# Patient Record
Sex: Male | Born: 1960
Health system: Southern US, Community
[De-identification: ages and names within clinical notes are randomized; demographics above are authoritative.]

## PROBLEM LIST (undated history)

## (undated) DIAGNOSIS — I10 Essential (primary) hypertension: Secondary | ICD-10-CM

## (undated) DIAGNOSIS — M87052 Idiopathic aseptic necrosis of left femur: Secondary | ICD-10-CM

## (undated) DIAGNOSIS — Z86718 Personal history of other venous thrombosis and embolism: Secondary | ICD-10-CM

## (undated) DIAGNOSIS — L03032 Cellulitis of left toe: Secondary | ICD-10-CM

## (undated) DIAGNOSIS — K219 Gastro-esophageal reflux disease without esophagitis: Secondary | ICD-10-CM

## (undated) DIAGNOSIS — G4730 Sleep apnea, unspecified: Secondary | ICD-10-CM

## (undated) HISTORY — PX: COLONOSCOPY: SHX174

## (undated) HISTORY — DX: Personal history of other venous thrombosis and embolism: Z86.718

## (undated) HISTORY — DX: Essential (primary) hypertension: I10

## (undated) HISTORY — PX: BACK SURGERY: SHX140

## (undated) HISTORY — PX: TONSILLECTOMY: SUR1361

## (undated) HISTORY — PX: KNEE SURGERY: SHX244

---

## 2004-10-01 ENCOUNTER — Ambulatory Visit: Payer: Self-pay | Admitting: Internal Medicine

## 2006-10-03 ENCOUNTER — Ambulatory Visit: Payer: Self-pay | Admitting: Unknown Physician Specialty

## 2008-04-15 ENCOUNTER — Ambulatory Visit: Payer: Self-pay | Admitting: Sports Medicine

## 2008-06-17 ENCOUNTER — Ambulatory Visit: Payer: Self-pay | Admitting: Unknown Physician Specialty

## 2008-06-25 ENCOUNTER — Inpatient Hospital Stay: Payer: Self-pay | Admitting: Unknown Physician Specialty

## 2008-07-05 ENCOUNTER — Ambulatory Visit: Payer: Self-pay | Admitting: Internal Medicine

## 2009-05-07 IMAGING — US US EXTREM LOW VENOUS*L*
1 series · 17 of 24 positions shown · non-contrast
Comparison: none

REASON FOR EXAM: Left leg pain and swelling
                  Call report to: [phone]
COMMENTS:

PROCEDURE:     US  - US DOPPLER LOW EXTR LEFT  - July 05, 2008  [DATE]
RESULT:     The LEFT common femoral vein is normally compressible as is the
superficial femoral vein. However, the popliteal vein is not normally
compressible and there is incompletely obstructing thrombus present within
it.

[Series 1: us extrem low venous*left* · 17 of 33 slices shown]
[im 1/33]
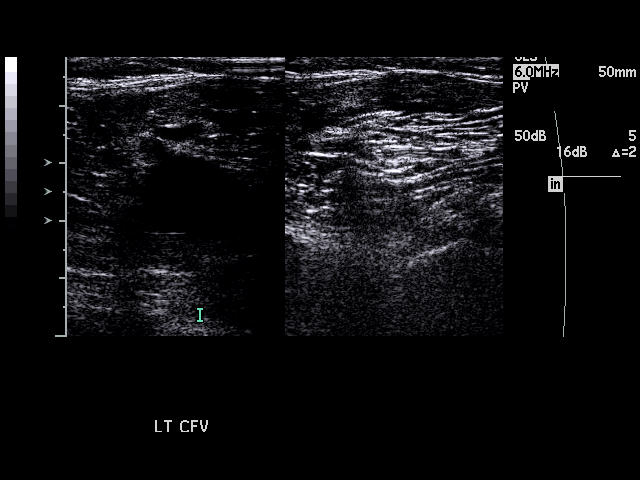
[im 3/33]
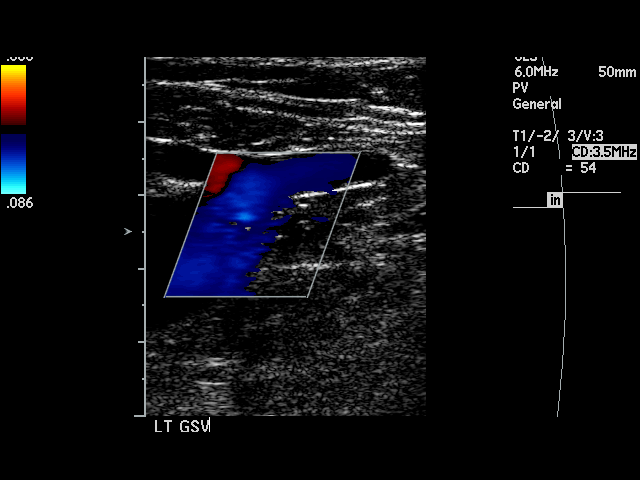
[im 5/33]
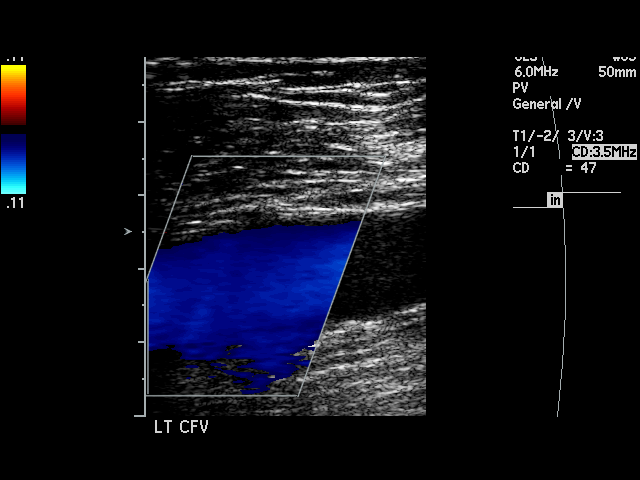
[im 6/33]
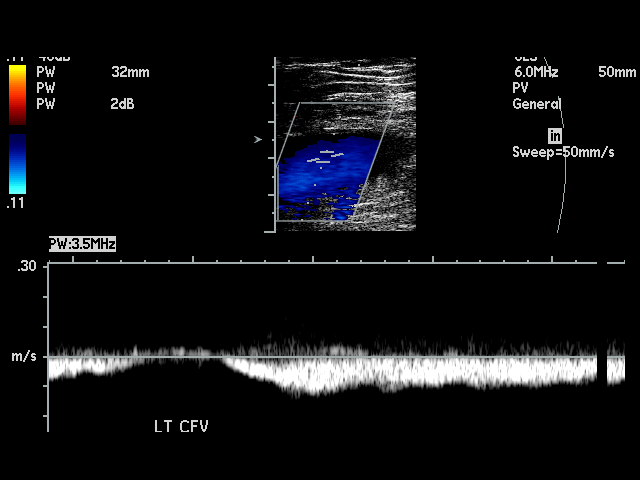
[im 9/33]
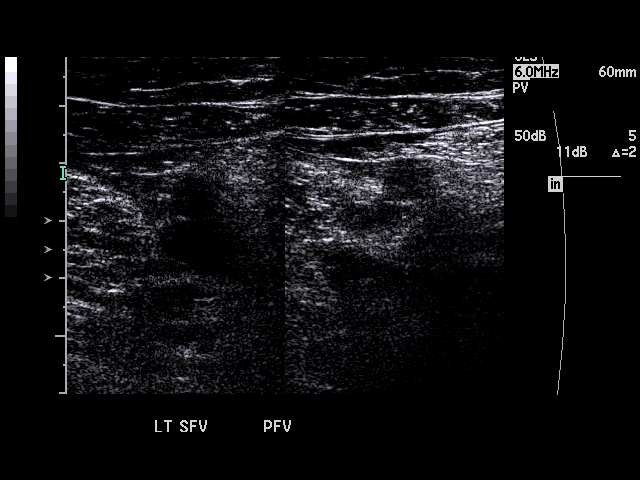
[im 10/33]
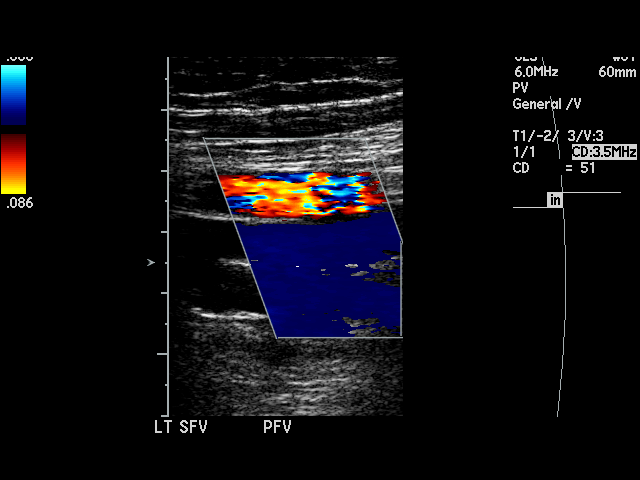
[im 13/33]
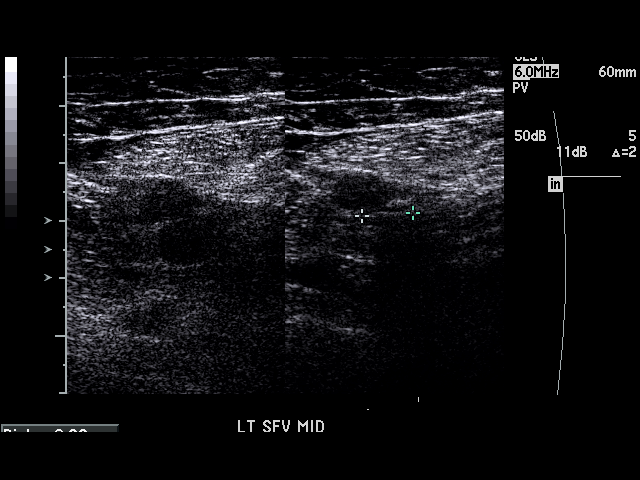
[im 14/33]
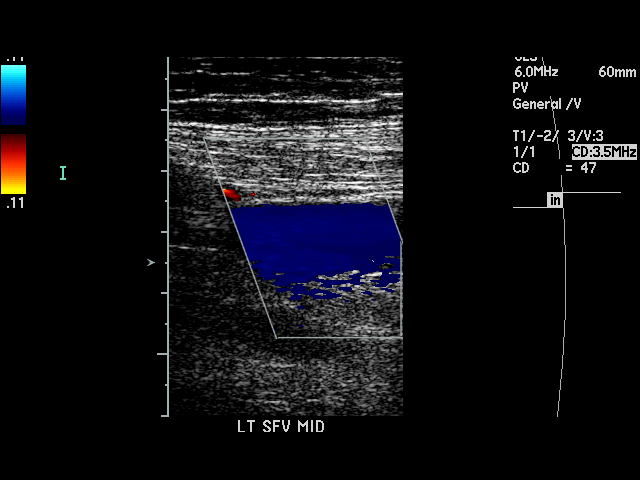
[im 17/33]
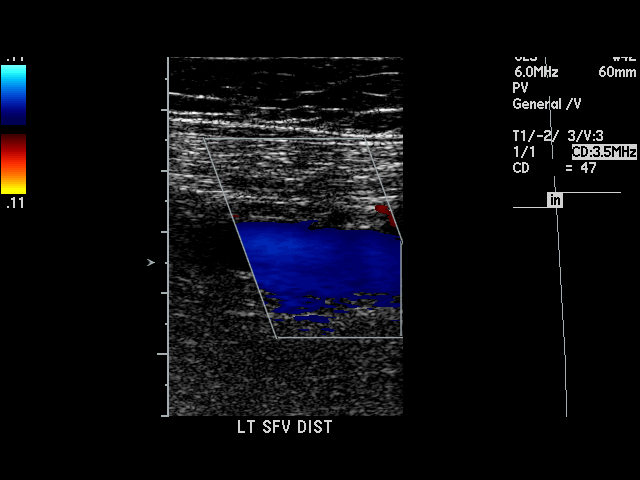
[im 19/33]
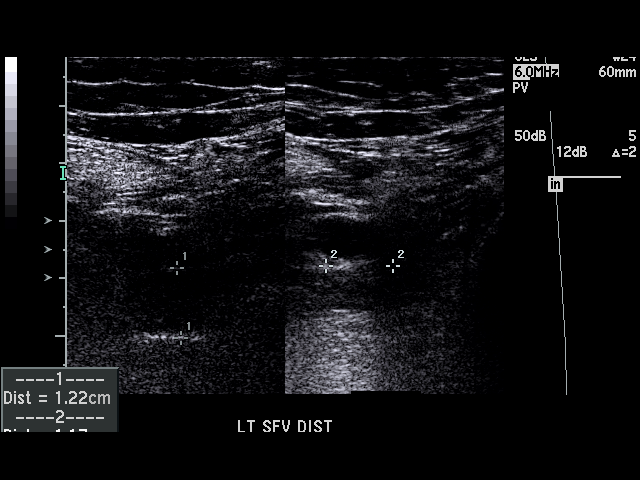
[im 20/33]
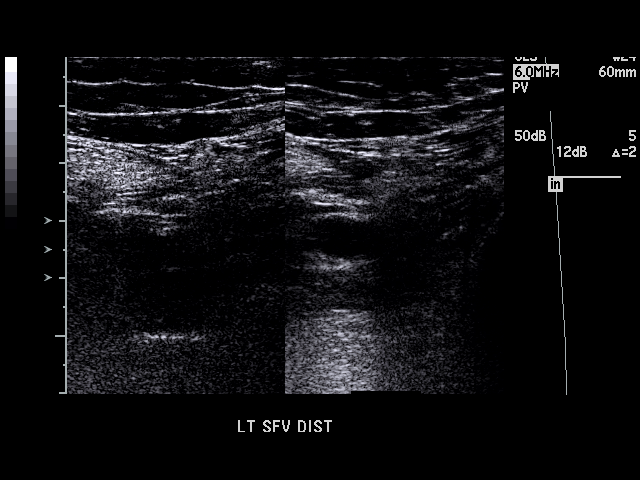
[im 23/33]
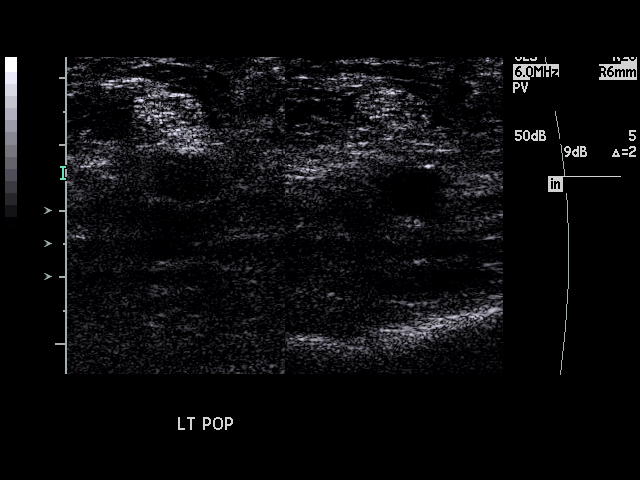
[im 24/33]
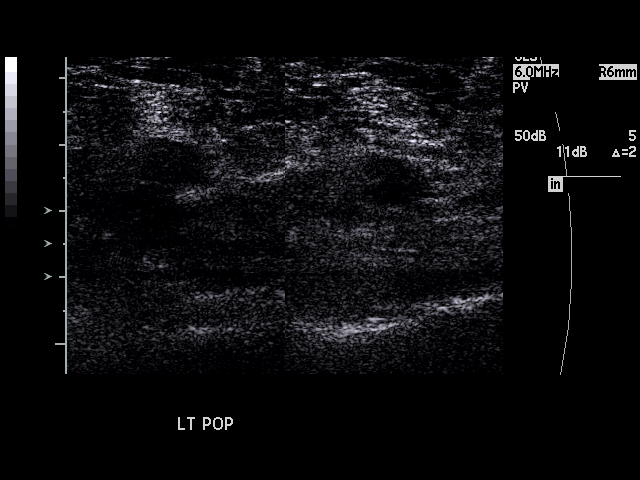
[im 27/33]
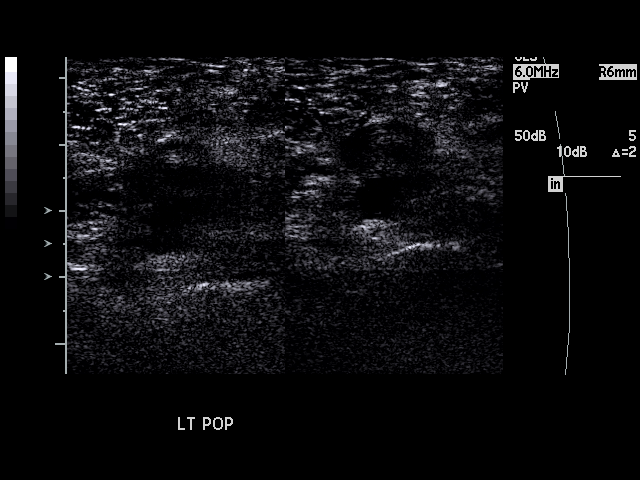
[im 28/33]
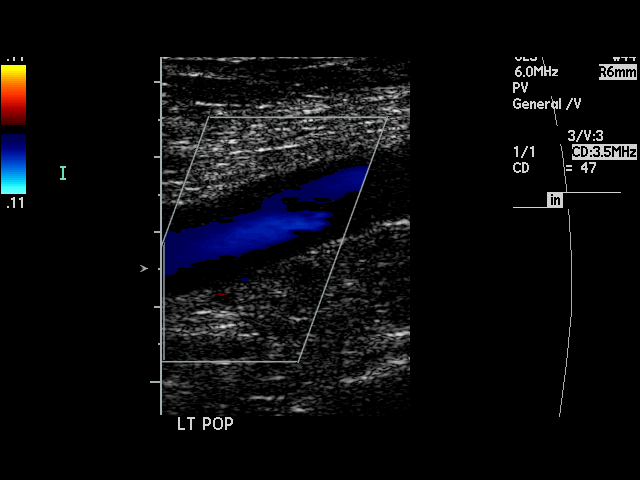
[im 30/33]
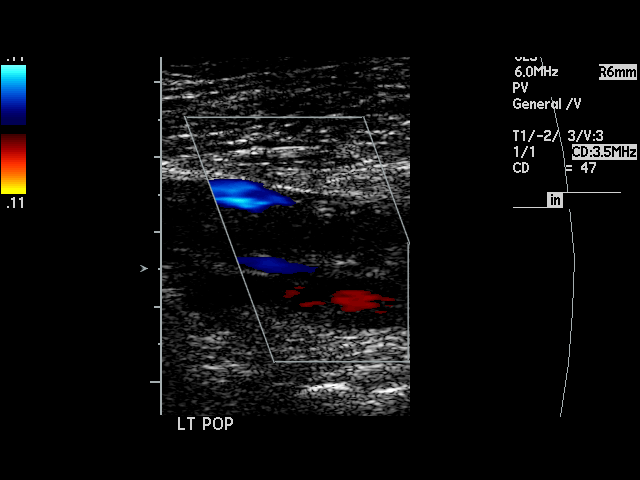
[im 33/33]
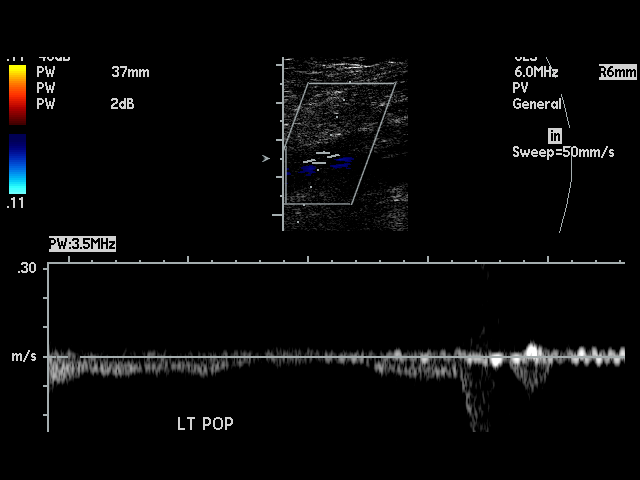

[17 of 24 positions shown; findings below may reference images not displayed]

IMPRESSION: There is non-occlusive thrombus within the popliteal vein
on the LEFT consistent with DVT. The superficial and common femoral veins
appear patent.

This report was called to Dr. [REDACTED] at [DATE] on 07/05/2008.

## 2010-01-10 ENCOUNTER — Ambulatory Visit: Payer: Self-pay

## 2010-01-20 ENCOUNTER — Ambulatory Visit: Payer: Self-pay | Admitting: Unknown Physician Specialty

## 2010-01-21 ENCOUNTER — Ambulatory Visit: Payer: Self-pay | Admitting: Unknown Physician Specialty

## 2012-01-10 ENCOUNTER — Ambulatory Visit: Payer: Self-pay | Admitting: Gastroenterology

## 2012-01-10 LAB — HM COLONOSCOPY

## 2012-07-25 ENCOUNTER — Ambulatory Visit: Payer: Self-pay | Admitting: Family Medicine

## 2012-10-17 ENCOUNTER — Ambulatory Visit: Payer: Self-pay | Admitting: Family Medicine

## 2013-07-11 ENCOUNTER — Ambulatory Visit: Payer: Self-pay | Admitting: Gastroenterology

## 2013-07-11 HISTORY — PX: UPPER GI ENDOSCOPY: SHX6162

## 2013-10-18 ENCOUNTER — Ambulatory Visit: Payer: Self-pay | Admitting: Otolaryngology

## 2013-11-13 ENCOUNTER — Ambulatory Visit (INDEPENDENT_AMBULATORY_CARE_PROVIDER_SITE_OTHER): Payer: BC Managed Care – PPO | Admitting: Pulmonary Disease

## 2013-11-13 ENCOUNTER — Encounter: Payer: Self-pay | Admitting: Pulmonary Disease

## 2013-11-13 VITALS — BP 146/102 | HR 98 | Temp 98.5°F | Ht 73.0 in | Wt 272.0 lb

## 2013-11-13 DIAGNOSIS — R05 Cough: Secondary | ICD-10-CM

## 2013-11-13 DIAGNOSIS — R059 Cough, unspecified: Secondary | ICD-10-CM

## 2013-11-13 MED ORDER — HYDROCOD POLST-CHLORPHEN POLST 10-8 MG/5ML PO LQCR: 5.0000 mL | Freq: Every evening | ORAL | Status: DC | PRN

## 2013-11-13 NOTE — Patient Instructions (Signed)
Take the tussionex for the cough at night, don't take this and drive Stop taking lisinopril  We will have you get blood work for pertussis and call you with the results  You don't need to take the inhaler any more  For sinus congestion/drainage I recommend taking a combination pill of chlorpheniramine and phenylephrine as needed  We will see you back in 6 weeks or sooner if needed

## 2013-11-13 NOTE — Progress Notes (Signed)
Subjective:    Patient ID: Darrell Montes, male    DOB: 1961-01-16, 52 y.o.   MRN: [id number]  HPI  This is a very pleasant 52 year old male who comes to our clinic today for evaluation of cough. He says this is been going on for about the last 6 months and he describes severe coughing paroxysms this will last for as much as 15 minutes at a time. It started insidiously without a clear upper respiratory infection. Although, he feels like he has "had a cold" for the last 6 months. He notes clear mucus production on nearly a daily basis. He has not had fevers or chills. He has not had chest tightness, wheezing, or shortness of breath. He has never been diagnosed with any lung diseases in the past. He never smoke cigarettes throughout his life. His childhood was normal without respiratory illnesses.  He notes that the coughing paroxysms or so bad that he often feels the need to throw up. He says the coughing is much worse at night.   He has acid reflux and about a month ago underwent an EGD during which time he had a Schatzki's ring dilated. He was placed on acid reflux medicine around this time. He says that this has not helped with his shortness of breath and he does not have ongoing acid reflux symptoms.  He does have some sinus congestion left greater than right and postnasal drip. He is currently not taking anything for postnasal drip.  The cough is made worse by going outside into cold air.  He has been on a Z-Pak a couple times and this helps.   Past Medical History  Diagnosis Date  . Hypertension   . Hx of blood clots      No family history on file.   History   Social History  . Marital Status: Married    Spouse Name: N/A    Number of Children: N/A  . Years of Education: N/A   Occupational History  . tech support    Social History Main Topics  . Smoking status: Never Smoker   . Smokeless tobacco: Never Used  . Alcohol Use: 1.5 oz/week    3 drink(s) per week  . Drug  Use: No  . Sexual Activity: Not on file   Other Topics Concern  . Not on file   Social History Narrative  . No narrative on file     Not on File   No outpatient prescriptions prior to visit.   No facility-administered medications prior to visit.      Review of Systems  Constitutional: Negative for fever and unexpected weight change.  HENT: Positive for congestion, postnasal drip and sinus pressure. Negative for dental problem, ear pain, nosebleeds, sneezing, sore throat and trouble swallowing.   Eyes: Negative for redness and itching.  Respiratory: Positive for cough. Negative for chest tightness, shortness of breath and wheezing.   Cardiovascular: Negative for palpitations and leg swelling.  Gastrointestinal: Negative for nausea and vomiting.  Genitourinary: Negative for dysuria.  Musculoskeletal: Negative for joint swelling.  Skin: Negative for rash.  Neurological: Negative for headaches.  Hematological: Does not bruise/bleed easily.  Psychiatric/Behavioral: Negative for dysphoric mood. The patient is not nervous/anxious.        Objective:   Physical Exam  Filed Vitals:   11/13/13 1637  BP: 146/102  Pulse: 98  Temp: 98.5 F (36.9 C)  TempSrc: Oral  Height: 6\' 1"  (1.854 m)  Weight: 272 lb (123.378 kg)  SpO2: 96%  RA  Gen: well appearing, no acute distress HEENT: NCAT, PERRL, EOMi, OP clear, neck supple without masses PULM: CTA B CV: RRR, no mgr, no JVD AB: BS+, soft, nontender, no hsm Ext: warm, no edema, no clubbing, no cyanosis Derm: no rash or skin breakdown Neuro: A&Ox4, CN II-XII intact, strength 5/5 in all 4 extremities  10/2013 CXR normal 10/2013 Simple spirometry > not acceptable by ATS criteria, no clear airflow obstruction     Assessment & Plan:   Cough I think the most likely etiology for [name] cough is the lisinopril at this point. It's very possible that he had a viral illness at the onset that was perpetuated by the  lisinopril. Also I think some degree of cyclical cough or cough perpetuated and cough is also at play.  He had difficulty with simple spirometry today but her recent chest x-ray was normal his lung exam is normal and his oxygenation is normal which encourages me that he doesn't have evidence of underlying pulmonary disease.  The most pragmatic and cost effective approach at this point is to observe him after stopping the lisinopril today. I agree with Dr. Elisabeth Montes decision to stop it.  We will check for pertussis given the post tussive emesis (nearly anyway) and prior response to azithromycin.  Plan: -pertussis antibodies and swab -Full pulmonary function test to evaluate for underlying lung disease (I doubt we will find any) -Continue GERD therapy -To observe off of lisinopril -Use Tussionex at night to help with cough -Followup in 6 weeks if no improvement   Updated Medication List Outpatient Encounter Prescriptions as of 11/13/2013  Medication Sig  . aspirin 81 MG tablet Take 81 mg by mouth daily.  . budesonide (PULMICORT FLEXHALER) 180 MCG/ACT inhaler Inhale 1 puff into the lungs daily.  Marland Kitchen DYMISTA 137-50 MCG/ACT SUSP daily. 1 spray each nostril  . pantoprazole (PROTONIX) 20 MG tablet Take 20 mg by mouth 2 (two) times daily.  . chlorpheniramine-HYDROcodone (TUSSIONEX PENNKINETIC ER) 10-8 MG/5ML LQCR Take 5 mLs by mouth at bedtime as needed for cough.

## 2013-11-14 DIAGNOSIS — R05 Cough: Secondary | ICD-10-CM | POA: Insufficient documentation

## 2013-11-14 DIAGNOSIS — R059 Cough, unspecified: Secondary | ICD-10-CM | POA: Insufficient documentation

## 2013-11-14 NOTE — Assessment & Plan Note (Addendum)
I think the most likely etiology for [name] cough is the lisinopril at this point. It's very possible that he had a viral illness at the onset that was perpetuated by the lisinopril. Also I think some degree of cyclical cough or cough perpetuated and cough is also at play.  He had difficulty with simple spirometry today but her recent chest x-ray was normal his lung exam is normal and his oxygenation is normal which encourages me that he doesn't have evidence of underlying pulmonary disease.  The most pragmatic and cost effective approach at this point is to observe him after stopping the lisinopril today. I agree with Dr. Elisabeth Cara decision to stop it.  We will check for pertussis given the post tussive emesis (nearly anyway) and prior response to azithromycin.  Plan: -pertussis antibodies and swab -Full pulmonary function test to evaluate for underlying lung disease (I doubt we will find any) -Continue GERD therapy -To observe off of lisinopril -Use Tussionex at night to help with cough -Followup in 6 weeks if no improvement

## 2013-11-15 ENCOUNTER — Other Ambulatory Visit (INDEPENDENT_AMBULATORY_CARE_PROVIDER_SITE_OTHER): Payer: BC Managed Care – PPO

## 2013-11-15 DIAGNOSIS — R05 Cough: Secondary | ICD-10-CM

## 2013-11-15 DIAGNOSIS — R059 Cough, unspecified: Secondary | ICD-10-CM

## 2013-11-17 LAB — BORDETELLA PERTUSSIS PCR
B parapertussis, DNA: NOT DETECTED
B pertussis, DNA: NOT DETECTED

## 2013-11-17 LAB — B PERTUSSIS IGG/IGM AB: B pertussis IgM Ab, Quant: <1 index (ref 0.0–0.9)

## 2013-11-19 ENCOUNTER — Telehealth: Payer: Self-pay

## 2013-11-19 NOTE — Telephone Encounter (Signed)
Patient returning call.

## 2013-11-19 NOTE — Telephone Encounter (Signed)
Called spoke with patient and advised of lab results / recs as stated by BQ Pt also advised on good hand hygeine and to continue to follow the recommendations from the 12.16.14 ov w/ BQ Pt to call if his cough worsens, or call in 4-6 weeks if there is no improvement Pt also encouraged with call with any questions/concerns Nothing further needed at this time; will sign off.  Per 12.16.14. pv w/ BQ: Patient Instructions     Take the tussionex for the cough at night, don't take this and drive  Stop taking lisinopril  We will have you get blood work for pertussis and call you with the results  You don't need to take the inhaler any more  For sinus congestion/drainage I recommend taking a combination pill of chlorpheniramine and phenylephrine as needed  We will see you back in 6 weeks or sooner if needed

## 2013-11-19 NOTE — Telephone Encounter (Signed)
LMTCB X1 

## 2013-11-19 NOTE — Telephone Encounter (Signed)
Message copied by Velvet Bathe on Mon Nov 19, 2013 12:43 PM ------      Message from: Lupita Leash      Created: Sat Nov 17, 2013  7:22 AM       A,            Please let him know that his pertussis antibodies are positive.  This means he has been exposed to pertussis in the past, but doesn't necessarily mean that he has been infected recently.  If his cough doesn't resolve in 4-6 weeks then we will treat him with an antibiotic again.  At this point I don't think he needs it.            Thanks      B ------

## 2013-12-04 ENCOUNTER — Telehealth: Payer: Self-pay | Admitting: Pulmonary Disease

## 2013-12-04 NOTE — Telephone Encounter (Signed)
lmomtcb x1 

## 2013-12-05 NOTE — Telephone Encounter (Signed)
Pt is requesting labs and OV note be sent to PCP. This has been faxed. Scott Bing, CMA

## 2015-02-04 LAB — LIPID PANEL
CHOLESTEROL: 229 mg/dL — AB (ref 0–200)
HDL: 42 mg/dL (ref 35–70)
LDL Cholesterol: 133 mg/dL
LDL/HDL RATIO: 3
TRIGLYCERIDES: 272 mg/dL — AB (ref 40–160)

## 2015-02-04 LAB — TSH: TSH: 2.61 u[IU]/mL (ref 0.41–5.90)

## 2015-02-04 LAB — CBC AND DIFFERENTIAL
HEMATOCRIT: 43 % (ref 41–53)
HEMOGLOBIN: 14.6 g/dL (ref 13.5–17.5)
Neutrophils Absolute: 4 /uL
Platelets: 144 10*3/uL — AB (ref 150–399)
WBC: 5.9 10^3/mL

## 2015-02-04 LAB — HEPATIC FUNCTION PANEL
ALT: 28 U/L (ref 10–40)
AST: 27 U/L (ref 14–40)
Alkaline Phosphatase: 105 U/L (ref 25–125)
BILIRUBIN, TOTAL: 0.4 mg/dL

## 2015-02-04 LAB — BASIC METABOLIC PANEL
BUN: 15 mg/dL (ref 4–21)
CREATININE: 0.7 mg/dL (ref 0.6–1.3)
Glucose: 102 mg/dL
POTASSIUM: 4.9 mmol/L (ref 3.4–5.3)
Sodium: 142 mmol/L (ref 137–147)

## 2015-04-02 LAB — PSA: PSA: 2.2

## 2015-07-28 ENCOUNTER — Telehealth: Payer: Self-pay | Admitting: Family Medicine

## 2015-07-28 NOTE — Telephone Encounter (Signed)
See below please-aa 

## 2015-07-28 NOTE — Telephone Encounter (Signed)
Pt got really bad sunburn on his feet last week.  Still has blister.  Wants to know if there is anything you can send to the pharmacy for him.  He uses [facility].    His call back [phone]  Lavonne Chick

## 2015-07-28 NOTE — Telephone Encounter (Signed)
Pt advised-aa 

## 2015-07-28 NOTE — Telephone Encounter (Signed)
Nothing that works any better than over-the-counter topicals. Half pharmacist help him find him to medical treatment for that.

## 2015-08-14 ENCOUNTER — Other Ambulatory Visit: Payer: Self-pay | Admitting: Family Medicine

## 2015-09-15 ENCOUNTER — Other Ambulatory Visit: Payer: Self-pay | Admitting: Family Medicine

## 2015-09-19 DIAGNOSIS — G4700 Insomnia, unspecified: Secondary | ICD-10-CM | POA: Insufficient documentation

## 2015-09-19 DIAGNOSIS — I10 Essential (primary) hypertension: Secondary | ICD-10-CM | POA: Insufficient documentation

## 2015-09-19 DIAGNOSIS — Z6837 Body mass index (BMI) 37.0-37.9, adult: Secondary | ICD-10-CM | POA: Insufficient documentation

## 2015-09-19 DIAGNOSIS — G5630 Lesion of radial nerve, unspecified upper limb: Secondary | ICD-10-CM | POA: Insufficient documentation

## 2015-09-19 DIAGNOSIS — G4733 Obstructive sleep apnea (adult) (pediatric): Secondary | ICD-10-CM | POA: Insufficient documentation

## 2015-09-19 DIAGNOSIS — E669 Obesity, unspecified: Secondary | ICD-10-CM | POA: Insufficient documentation

## 2015-09-19 DIAGNOSIS — I82509 Chronic embolism and thrombosis of unspecified deep veins of unspecified lower extremity: Secondary | ICD-10-CM | POA: Insufficient documentation

## 2015-09-19 DIAGNOSIS — E785 Hyperlipidemia, unspecified: Secondary | ICD-10-CM | POA: Insufficient documentation

## 2015-09-19 DIAGNOSIS — K219 Gastro-esophageal reflux disease without esophagitis: Secondary | ICD-10-CM | POA: Insufficient documentation

## 2015-09-19 DIAGNOSIS — J309 Allergic rhinitis, unspecified: Secondary | ICD-10-CM | POA: Insufficient documentation

## 2015-09-19 DIAGNOSIS — I82409 Acute embolism and thrombosis of unspecified deep veins of unspecified lower extremity: Secondary | ICD-10-CM

## 2015-10-01 ENCOUNTER — Ambulatory Visit (INDEPENDENT_AMBULATORY_CARE_PROVIDER_SITE_OTHER): Payer: BLUE CROSS/BLUE SHIELD | Admitting: Family Medicine

## 2015-10-01 ENCOUNTER — Encounter: Payer: Self-pay | Admitting: Family Medicine

## 2015-10-01 VITALS — BP 128/84 | HR 76 | Temp 98.9°F | Resp 14 | Ht 74.0 in | Wt 273.0 lb

## 2015-10-01 DIAGNOSIS — G4733 Obstructive sleep apnea (adult) (pediatric): Secondary | ICD-10-CM | POA: Diagnosis not present

## 2015-10-01 DIAGNOSIS — L309 Dermatitis, unspecified: Secondary | ICD-10-CM | POA: Diagnosis not present

## 2015-10-01 DIAGNOSIS — Z23 Encounter for immunization: Secondary | ICD-10-CM

## 2015-10-01 DIAGNOSIS — E669 Obesity, unspecified: Secondary | ICD-10-CM | POA: Diagnosis not present

## 2015-10-01 DIAGNOSIS — E785 Hyperlipidemia, unspecified: Secondary | ICD-10-CM

## 2015-10-01 DIAGNOSIS — G4700 Insomnia, unspecified: Secondary | ICD-10-CM | POA: Diagnosis not present

## 2015-10-01 DIAGNOSIS — I10 Essential (primary) hypertension: Secondary | ICD-10-CM | POA: Diagnosis not present

## 2015-10-01 DIAGNOSIS — K219 Gastro-esophageal reflux disease without esophagitis: Secondary | ICD-10-CM | POA: Diagnosis not present

## 2015-10-01 MED ORDER — MOMETASONE FUROATE 0.1 % EX CREA: 1.0000 "application " | TOPICAL_CREAM | Freq: Every day | CUTANEOUS | Status: DC

## 2015-10-01 NOTE — Patient Instructions (Signed)
Try to wear CPAP machine every night. This should improve some of your symptoms. Will follow up on the next visit.

## 2015-10-01 NOTE — Progress Notes (Signed)
Patient ID: Darrell Montes, male   DOB: 06/11/1961, 54 y.o.   MRN: [id number]    Subjective:  HPI  Hypertension follow up: Patient was last seen in May 2016. He checks his B/P rarely at the pharmacy and does not remember the readings at this point. BP Readings from Last 3 Encounters:  10/01/15 128/84  04/02/15 124/82  11/13/13 146/102  No cardiac symptoms present.  GERD follow up: Symptoms are controlled on Protonix daily. Rarely has any problems with this.  Insomnia follow up: Patient takes Trazodone about 3 to 4 nights a week. Nocturia Up every 1 and 1/2 to 2 hours.Small amounts. Prior to Admission medications   Medication Sig Start Date End Date Taking? Authorizing Provider  amLODipine (NORVASC) 5 MG tablet TAKE 1 TABLET BY MOUTH ONCE DAILY 09/15/15  Yes Jerrol Banana., MD  aspirin 81 MG tablet Take 81 mg by mouth daily.   Yes Historical Provider, MD  DYMISTA 137-50 MCG/ACT SUSP daily. 1 spray each nostril   Yes Historical Provider, MD  Multiple Vitamin tablet Take by mouth.   Yes Historical Provider, MD  naproxen sodium (ALEVE) 220 MG tablet Take by mouth.   Yes Historical Provider, MD  pantoprazole (PROTONIX) 40 MG tablet TAKE 1 TABLET BY MOUTH ONCE DAILY, 30 MINUTES BEFORE THE EVENING MEAL. 08/14/15  Yes Anique Beckley Maceo Pro., MD  traZODone (DESYREL) 100 MG tablet Take by mouth. 02/03/15  Yes Historical Provider, MD    Patient Active Problem List   Diagnosis Date Noted  . Allergic rhinitis 09/19/2015  . Acute thromboembolism of deep veins of lower extremity ([place]) 09/19/2015  . Essential (primary) hypertension 09/19/2015  . Acid reflux 09/19/2015  . HLD (hyperlipidemia) 09/19/2015  . Adiposity 09/19/2015  . Obstructive apnea 09/19/2015  . Cannot sleep 09/19/2015  . Radial nerve disease 09/19/2015  . Cough 11/14/2013    Past Medical History  Diagnosis Date  . Hypertension   . Hx of blood clots     Social History   Social History  . Marital Status: Single      Spouse Name: N/A  . Number of Children: N/A  . Years of Education: N/A   Occupational History  . tech support    Social History Main Topics  . Smoking status: Never Smoker   . Smokeless tobacco: Never Used  . Alcohol Use: 1.5 oz/week    3 Standard drinks or equivalent per week     Comment: socialy  . Drug Use: No  . Sexual Activity: Not on file   Other Topics Concern  . Not on file   Social History Narrative    No Known Allergies  Review of Systems  Constitutional: Positive for malaise/fatigue.  Respiratory: Negative.   Cardiovascular: Negative.   Gastrointestinal: Negative.   Genitourinary: Positive for frequency (nocturia).  Musculoskeletal: Positive for back pain (at times).  Skin:       Bumps on left foot  Psychiatric/Behavioral: The patient has insomnia (stable).     Immunization History  Administered Date(s) Administered  . Influenza Split 11/13/2013  . Tdap 04/28/2011   Objective:  BP 128/84 mmHg  Pulse 76  Temp(Src) 98.9 F (37.2 C)  Resp 14  Ht 6\' 2"  (1.88 m)  Wt 273 lb (123.832 kg)  BMI 35.04 kg/m2  Physical Exam  Constitutional: He is oriented to person, place, and time and well-developed, well-nourished, and in no distress.  HENT:  Head: Normocephalic.  Eyes: Conjunctivae are normal. Pupils are equal, round, and reactive  to light.  Neck: Normal range of motion. Neck supple.  Cardiovascular: Normal rate, regular rhythm, normal heart sounds and intact distal pulses.   No murmur heard. Pulmonary/Chest: Effort normal and breath sounds normal. No respiratory distress. He has no wheezes.  Musculoskeletal: Normal range of motion. He exhibits no edema or tenderness.  Neurological: He is alert and oriented to person, place, and time.  Skin: Skin is warm and dry. Rash noted. There is erythema.  On left foot.  Psychiatric: Mood, memory, affect and judgment normal.    Lab Results  Component Value Date   WBC 5.9 02/04/2015   HGB 14.6 02/04/2015    HCT 43 02/04/2015   PLT 144* 02/04/2015   CHOL 229* 02/04/2015   TRIG 272* 02/04/2015   HDL 42 02/04/2015   LDLCALC 133 02/04/2015   TSH 2.61 02/04/2015   PSA 2.2 04/02/2015    CMP     Component Value Date/Time   NA 142 02/04/2015   K 4.9 02/04/2015   BUN 15 02/04/2015   CREATININE 0.7 02/04/2015   AST 27 02/04/2015   ALT 28 02/04/2015   ALKPHOS 105 02/04/2015    Assessment and Plan :  1. Essential (primary) hypertension Stable. Continue current medication.  2. Obstructive apnea Patient is not tolerating Cpap machine mask to be able to wear it every night. Discuss this with patient and the benefits of getting used to the CPAP which could help with insomnia and nocturia.  3. Gastroesophageal reflux disease, esophagitis presence not specified Stable on medication.  4. HLD (hyperlipidemia) Stable.  5. Cannot sleep Stable on Trazodone as needed use.  6. Adiposity Work on habits.  7. Need for influenza vaccination - Flu Vaccine QUAD 36+ mos PF IM (Fluarix & Fluzone Quad PF)  8. Eczema of the left foot Will try Mometasone cream. 9.BPH Patient was seen and examined by Dr. Eulas Post and note was scribed by Theressa Millard, RMA.   Miguel Aschoff MD [facility] 10/01/2015 8:35 AM

## 2015-10-13 ENCOUNTER — Ambulatory Visit (INDEPENDENT_AMBULATORY_CARE_PROVIDER_SITE_OTHER): Payer: BLUE CROSS/BLUE SHIELD | Admitting: Family Medicine

## 2015-10-13 ENCOUNTER — Telehealth: Payer: Self-pay | Admitting: Family Medicine

## 2015-10-13 VITALS — BP 150/92 | HR 76 | Temp 97.8°F | Resp 14 | Wt 276.0 lb

## 2015-10-13 DIAGNOSIS — R21 Rash and other nonspecific skin eruption: Secondary | ICD-10-CM

## 2015-10-13 DIAGNOSIS — B356 Tinea cruris: Secondary | ICD-10-CM

## 2015-10-13 MED ORDER — NYSTATIN 100000 UNIT/GM EX CREA: 1.0000 "application " | TOPICAL_CREAM | Freq: Three times a day (TID) | CUTANEOUS | Status: DC

## 2015-10-13 NOTE — Telephone Encounter (Signed)
Patient advised to be seen.

## 2015-10-13 NOTE — Progress Notes (Signed)
Patient ID: Darrell Montes, male   DOB: [birth year], 54 y.o.   MRN: [id number]   Darrell Montes  MRN: [id number] DOB: Apr 21, 1961  Subjective:  HPI   1. Rash The patient is a 54 year old male who presents for evaluation of a rash.  He states he has had it for about 1 week, however it has gotten worse in the last few days.  It is along the inside of both thighs.  It is itchy, large red areas and not tender.  He has no associated symptoms.  He notes no change in laundry detergent, soaps etc and he ha not been doing anything such as working in the yard that he would think could cause the rash.  Patient Active Problem List   Diagnosis Date Noted  . Allergic rhinitis 09/19/2015  . Acute thromboembolism of deep veins of lower extremity ([place]) 09/19/2015  . Essential (primary) hypertension 09/19/2015  . Acid reflux 09/19/2015  . HLD (hyperlipidemia) 09/19/2015  . Adiposity 09/19/2015  . Obstructive apnea 09/19/2015  . Cannot sleep 09/19/2015  . Radial nerve disease 09/19/2015  . Cough 11/14/2013    Past Medical History  Diagnosis Date  . Hypertension   . Hx of blood clots     Social History   Social History  . Marital Status: Single    Spouse Name: N/A  . Number of Children: N/A  . Years of Education: N/A   Occupational History  . tech support    Social History Main Topics  . Smoking status: Never Smoker   . Smokeless tobacco: Never Used  . Alcohol Use: 1.5 oz/week    3 Standard drinks or equivalent per week     Comment: socialy  . Drug Use: No  . Sexual Activity: Not on file   Other Topics Concern  . Not on file   Social History Narrative    Outpatient Prescriptions Prior to Visit  Medication Sig Dispense Refill  . amLODipine (NORVASC) 5 MG tablet TAKE 1 TABLET BY MOUTH ONCE DAILY 30 tablet 6  . aspirin 81 MG tablet Take 81 mg by mouth daily.    Marland Kitchen DYMISTA 137-50 MCG/ACT SUSP daily. 1 spray each nostril    . mometasone (ELOCON) 0.1 % cream Apply 1 application topically  daily. 45 g 0  . Multiple Vitamin tablet Take by mouth.    . naproxen sodium (ALEVE) 220 MG tablet Take by mouth.    . pantoprazole (PROTONIX) 40 MG tablet TAKE 1 TABLET BY MOUTH ONCE DAILY, 30 MINUTES BEFORE THE EVENING MEAL. 30 tablet 12  . traZODone (DESYREL) 100 MG tablet Take by mouth.     No facility-administered medications prior to visit.    No Known Allergies  Review of Systems  Constitutional: Negative for fever, chills, weight loss, malaise/fatigue and diaphoresis.  Eyes: Negative.   Respiratory: Negative for cough, hemoptysis, sputum production, shortness of breath and wheezing.   Cardiovascular: Negative for chest pain, palpitations, orthopnea, claudication, leg swelling and PND.  Gastrointestinal: Negative.   Musculoskeletal: Negative for myalgias and joint pain.  Skin: Positive for itching and rash.  Neurological: Negative for dizziness, weakness and headaches.   Objective:  BP 150/92 mmHg  Pulse 76  Temp(Src) 97.8 F (36.6 C) (Oral)  Resp 14  Wt 276 lb (125.193 kg)  Physical Exam  Constitutional: He is oriented to person, place, and time and well-developed, well-nourished, and in no distress.  HENT:  Head: Normocephalic and atraumatic.  Right Ear: External ear normal.  Left Ear: External ear normal.  Nose: Nose normal.  Eyes: Conjunctivae are normal.  Neck: Neck supple.  Cardiovascular: Normal rate, regular rhythm and normal heart sounds.   Abdominal: Soft.  Neurological: He is alert and oriented to person, place, and time.  Skin: Skin is warm and dry. Rash noted.  Erythematous rash with satellite lesions in the groin.  Psychiatric: Mood, memory, affect and judgment normal.    Assessment and Plan :  Rash/yeast dermatitis Treat with nystatin cream 3 times a day for a week  Miguel Aschoff MD [facility] 10/13/2015 2:20 PM

## 2015-10-13 NOTE — Telephone Encounter (Signed)
Please call patient about a rash ( irritation ) on his legs. Itching.  Call back is   [phone]  Thanks Con Memos

## 2015-10-14 ENCOUNTER — Ambulatory Visit: Payer: BLUE CROSS/BLUE SHIELD | Admitting: Family Medicine

## 2015-11-19 ENCOUNTER — Other Ambulatory Visit: Payer: Self-pay | Admitting: Family Medicine

## 2015-12-09 ENCOUNTER — Ambulatory Visit (INDEPENDENT_AMBULATORY_CARE_PROVIDER_SITE_OTHER): Payer: BLUE CROSS/BLUE SHIELD | Admitting: Family Medicine

## 2015-12-09 ENCOUNTER — Encounter: Payer: Self-pay | Admitting: Family Medicine

## 2015-12-09 VITALS — BP 112/74 | HR 72 | Temp 97.6°F | Resp 16 | Wt 277.2 lb

## 2015-12-09 DIAGNOSIS — R05 Cough: Secondary | ICD-10-CM | POA: Diagnosis not present

## 2015-12-09 DIAGNOSIS — J069 Acute upper respiratory infection, unspecified: Secondary | ICD-10-CM | POA: Diagnosis not present

## 2015-12-09 DIAGNOSIS — R059 Cough, unspecified: Secondary | ICD-10-CM

## 2015-12-09 MED ORDER — HYDROCODONE-HOMATROPINE 5-1.5 MG/5ML PO SYRP: 5.0000 mL | ORAL_SOLUTION | Freq: Three times a day (TID) | ORAL | Status: DC | PRN

## 2015-12-09 NOTE — Progress Notes (Signed)
Patient ID: Darrell Montes, male   DOB: November 02, 1961, 55 y.o.   MRN: [id number]   Patient: Darrell Montes Male    DOB: 1961-08-11   55 y.o.   MRN: [id number] Visit Date: 12/09/2015  Today's Provider: Vernie Murders, PA   Chief Complaint  Patient presents with  . URI   Subjective:    URI  This is a new problem. The current episode started in the past 7 days. The problem has been unchanged. There has been no fever. Associated symptoms include congestion and coughing. Pertinent negatives include no chest pain, nausea, plugged ear sensation, rhinorrhea, sinus pain, sneezing or sore throat. Associated symptoms comments: Sputum production is clear in color.. He has tried decongestant for the symptoms. The treatment provided no relief.   Patient Active Problem List   Diagnosis Date Noted  . Allergic rhinitis 09/19/2015  . Acute thromboembolism of deep veins of lower extremity ([place]) 09/19/2015  . Essential (primary) hypertension 09/19/2015  . Acid reflux 09/19/2015  . HLD (hyperlipidemia) 09/19/2015  . Adiposity 09/19/2015  . Obstructive apnea 09/19/2015  . Cannot sleep 09/19/2015  . Radial nerve disease 09/19/2015  . Cough 11/14/2013   Past Surgical History  Procedure Laterality Date  . Knee surgery Left     X2 scope  . Back surgery      X2 lower disc   . Upper gi endoscopy  07/11/2013    normal duodenum, benign appearing esophageal stricture x2    Family History  Problem Relation Age of Onset  . COPD Mother   . Bladder Cancer Mother   . Hypertension Father   . COPD Father   . Sleep apnea Father    No Known Allergies   Previous Medications   AMLODIPINE (NORVASC) 5 MG TABLET    TAKE 1 TABLET BY MOUTH ONCE DAILY   ASPIRIN 81 MG TABLET    Take 81 mg by mouth daily.   DYMISTA 137-50 MCG/ACT SUSP    daily. 1 spray each nostril   MOMETASONE (ELOCON) 0.1 % CREAM    APPLY TOPICALLY TO AFFECTED AREA(S) ONCEDAILY.   MULTIPLE VITAMIN TABLET    Take by mouth.   NAPROXEN SODIUM (ALEVE) 220  MG TABLET    Take by mouth.   NYSTATIN CREAM (MYCOSTATIN)    Apply 1 application topically 3 (three) times daily.   PANTOPRAZOLE (PROTONIX) 40 MG TABLET    TAKE 1 TABLET BY MOUTH ONCE DAILY, 30 MINUTES BEFORE THE EVENING MEAL.   TRAZODONE (DESYREL) 100 MG TABLET    Take by mouth.    Review of Systems  Constitutional: Negative.   HENT: Positive for congestion. Negative for rhinorrhea, sneezing and sore throat.   Eyes: Negative.   Respiratory: Positive for cough.        Sputum production  Cardiovascular: Negative.  Negative for chest pain.  Gastrointestinal: Negative.  Negative for nausea.  Endocrine: Negative.   Genitourinary: Negative.   Musculoskeletal: Negative.   Skin: Negative.   Allergic/Immunologic: Negative.   Neurological: Negative.   Hematological: Negative.   Psychiatric/Behavioral: Negative.     Social History  Substance Use Topics  . Smoking status: Never Smoker   . Smokeless tobacco: Never Used  . Alcohol Use: 1.5 oz/week    3 Standard drinks or equivalent per week     Comment: socialy   Objective:   BP 112/74 mmHg  Pulse 72  Temp(Src) 97.6 F (36.4 C) (Oral)  Resp 16  Wt 277 lb 3.2 oz (125.737 kg)  SpO2  98%  Physical Exam  Constitutional: He is oriented to person, place, and time. He appears well-developed and well-nourished.  HENT:  Head: Normocephalic.  Right Ear: External ear normal.  Left Ear: External ear normal.  Mouth/Throat: Oropharynx is clear and moist.  Slightly swollen pink turbinates.  Eyes: Conjunctivae and EOM are normal.  Neck: Normal range of motion. Neck supple.  Cardiovascular: Normal rate, regular rhythm and normal heart sounds.   Pulmonary/Chest: Effort normal and breath sounds normal. He has no wheezes. He has no rales.  Abdominal: Bowel sounds are normal.  Neurological: He is alert and oriented to person, place, and time.  Psychiatric: He has a normal mood and affect. His behavior is normal.      Assessment & Plan:       1. Cough Onset over the past 3-4 days without fever. Some clear sputum. Worse out in the cold and snow shoveling. Increase fluid intake and add Hycodan. Recheck if any purulent sputum or fever development.  - HYDROcodone-homatropine (HYCODAN) 5-1.5 MG/5ML syrup; Take 5 mLs by mouth every 8 (eight) hours as needed for cough.  Dispense: 120 mL; Refill: 0  2. Upper respiratory infection Some nasal stuffiness without post nasal drip or rhinorrhea. May use OTC Flonase at bedtime and recheck prn.

## 2015-12-22 ENCOUNTER — Other Ambulatory Visit: Payer: Self-pay | Admitting: Family Medicine

## 2015-12-22 NOTE — Telephone Encounter (Signed)
This was last prescribed 2 weeks ago. If still having respiratory problems, need follow up appointment to be sure he has not developed pneumonia.

## 2015-12-22 NOTE — Telephone Encounter (Signed)
Pt called to see if RX for HYDROcodone-homatropine (HYCODAN) 5-1.5 MG/5ML syrup had been sent to the pharmacy. I advised that this type of medication would have to be picked up and someone would call when it's ready for pick up if it's approved. Thanks TNP

## 2015-12-22 NOTE — Telephone Encounter (Signed)
Pt contacted office for refill request on the following medications:  HYDROcodone-homatropine (HYCODAN) 5-1.5 MG/5ML syrup.  CB#[phone]/MW

## 2015-12-23 ENCOUNTER — Ambulatory Visit (INDEPENDENT_AMBULATORY_CARE_PROVIDER_SITE_OTHER): Payer: BLUE CROSS/BLUE SHIELD | Admitting: Family Medicine

## 2015-12-23 VITALS — BP 118/84 | HR 80 | Temp 97.5°F | Resp 14 | Wt 275.0 lb

## 2015-12-23 DIAGNOSIS — J40 Bronchitis, not specified as acute or chronic: Secondary | ICD-10-CM

## 2015-12-23 DIAGNOSIS — R05 Cough: Secondary | ICD-10-CM | POA: Diagnosis not present

## 2015-12-23 DIAGNOSIS — R5381 Other malaise: Secondary | ICD-10-CM | POA: Diagnosis not present

## 2015-12-23 DIAGNOSIS — R059 Cough, unspecified: Secondary | ICD-10-CM

## 2015-12-23 MED ORDER — AZITHROMYCIN 250 MG PO TABS: ORAL_TABLET | ORAL | Status: DC

## 2015-12-23 MED ORDER — HYDROCOD POLST-CPM POLST ER 10-8 MG/5ML PO SUER: 5.0000 mL | Freq: Two times a day (BID) | ORAL | Status: DC | PRN

## 2015-12-23 NOTE — Telephone Encounter (Signed)
LMTCB

## 2015-12-23 NOTE — Progress Notes (Signed)
Patient ID: Darrell Montes, male   DOB: May 13, 1961, 55 y.o.   MRN: [id number]    Subjective:  HPI  Patient was seen on January 10th by Simona Huh. For cough and URI and was given Flonase and Hycodan syrup. He has been out of it for 2 days and so he has been using Nyquil which has not helped. Cough keeps him up at night. He is still having post nasal drainage. Today he has been sweating more and breathing has been a little more heavier. No chest pain or tightness, no leg swelling.  Prior to Admission medications   Medication Sig Start Date End Date Taking? Authorizing Provider  amLODipine (NORVASC) 5 MG tablet TAKE 1 TABLET BY MOUTH ONCE DAILY 09/15/15  Yes Jerrol Banana., MD  aspirin 81 MG tablet Take 81 mg by mouth daily.   Yes Historical Provider, MD  DYMISTA 137-50 MCG/ACT SUSP daily. 1 spray each nostril   Yes Historical Provider, MD  mometasone (ELOCON) 0.1 % cream APPLY TOPICALLY TO AFFECTED AREA(S) ONCEDAILY. 11/20/15  Yes Richard Maceo Pro., MD  Multiple Vitamin tablet Take by mouth.   Yes Historical Provider, MD  naproxen sodium (ALEVE) 220 MG tablet Take by mouth.   Yes Historical Provider, MD  nystatin cream (MYCOSTATIN) Apply 1 application topically 3 (three) times daily. 10/13/15  Yes Richard Maceo Pro., MD  pantoprazole (PROTONIX) 40 MG tablet TAKE 1 TABLET BY MOUTH ONCE DAILY, 30 MINUTES BEFORE THE EVENING MEAL. 08/14/15  Yes Richard Maceo Pro., MD  traZODone (DESYREL) 100 MG tablet Take by mouth. 02/03/15  Yes Historical Provider, MD  HYDROcodone-homatropine (HYCODAN) 5-1.5 MG/5ML syrup Take 5 mLs by mouth every 8 (eight) hours as needed for cough. Patient not taking: Reported on 12/23/2015 12/09/15   Margo Common, PA    Patient Active Problem List   Diagnosis Date Noted  . Allergic rhinitis 09/19/2015  . Acute thromboembolism of deep veins of lower extremity ([place]) 09/19/2015  . Essential (primary) hypertension 09/19/2015  . Acid reflux 09/19/2015  . HLD  (hyperlipidemia) 09/19/2015  . Adiposity 09/19/2015  . Obstructive apnea 09/19/2015  . Cannot sleep 09/19/2015  . Radial nerve disease 09/19/2015  . Cough 11/14/2013    Past Medical History  Diagnosis Date  . Hypertension   . Hx of blood clots     Social History   Social History  . Marital Status: Single    Spouse Name: N/A  . Number of Children: N/A  . Years of Education: N/A   Occupational History  . tech support    Social History Main Topics  . Smoking status: Never Smoker   . Smokeless tobacco: Never Used  . Alcohol Use: 1.5 oz/week    3 Standard drinks or equivalent per week     Comment: socialy  . Drug Use: No  . Sexual Activity: Not on file   Other Topics Concern  . Not on file   Social History Narrative    No Known Allergies  Review of Systems  Constitutional: Positive for malaise/fatigue. Negative for fever and chills.  HENT: Positive for congestion.   Respiratory: Positive for cough and shortness of breath.   Cardiovascular: Negative.   Musculoskeletal: Negative.   Neurological: Negative for weakness.  Psychiatric/Behavioral: Negative.     Immunization History  Administered Date(s) Administered  . Influenza Split 11/13/2013  . Influenza,inj,Quad PF,36+ Mos 10/01/2015  . Tdap 04/28/2011   Objective:  BP 118/84 mmHg  Pulse 80  Temp(Src) 97.5 F (  36.4 C)  Resp 14  Wt 275 lb (124.739 kg)  SpO2 96%  Physical Exam  Constitutional: He is oriented to person, place, and time and well-developed, well-nourished, and in no distress.  HENT:  Head: Normocephalic and atraumatic.  Right Ear: External ear normal.  Left Ear: External ear normal.  Eyes: Conjunctivae are normal. Pupils are equal, round, and reactive to light.  Neck: Normal range of motion. Neck supple.  Cardiovascular: Normal rate, regular rhythm, normal heart sounds and intact distal pulses.   No murmur heard. Pulmonary/Chest: Effort normal and breath sounds normal. No respiratory  distress. He has no wheezes.  Musculoskeletal: Normal range of motion. He exhibits no edema or tenderness.  Neurological: He is alert and oriented to person, place, and time.  Skin: Skin is warm and dry.  Psychiatric: Mood, memory, affect and judgment normal.    Lab Results  Component Value Date   WBC 5.9 02/04/2015   HGB 14.6 02/04/2015   HCT 43 02/04/2015   PLT 144* 02/04/2015   CHOL 229* 02/04/2015   TRIG 272* 02/04/2015   HDL 42 02/04/2015   LDLCALC 133 02/04/2015   TSH 2.61 02/04/2015   PSA 2.2 04/02/2015    CMP     Component Value Date/Time   NA 142 02/04/2015   K 4.9 02/04/2015   BUN 15 02/04/2015   CREATININE 0.7 02/04/2015   AST 27 02/04/2015   ALT 28 02/04/2015   ALKPHOS 105 02/04/2015    Assessment and Plan :  1. Cough Will switch Hycodan to Tussionex if patient can afford. May need Prednisone and/or chest xray. Follow as needed. - chlorpheniramine-HYDROcodone (TUSSIONEX PENNKINETIC ER) 10-8 MG/5ML SUER; Take 5 mLs by mouth every 12 (twelve) hours as needed for cough.  Dispense: 140 mL; Refill: 0  2. Malaise  3. Bronchitis Treat with Zpak. - azithromycin (ZITHROMAX) 250 MG tablet; As directed  Dispense: 6 tablet; Refill: 0  I have done the exam and reviewed the above chart and it is accurate to the best of my knowledge.  Miguel Aschoff MD [facility] 12/23/2015 3:55 PM

## 2015-12-23 NOTE — Telephone Encounter (Signed)
Patient advised as directed below. Darrell Montes does not have any available appointments until Thursday. Patient scheduled with Dr. Rosanna Randy today.

## 2016-02-25 ENCOUNTER — Ambulatory Visit (INDEPENDENT_AMBULATORY_CARE_PROVIDER_SITE_OTHER): Payer: BLUE CROSS/BLUE SHIELD | Admitting: Family Medicine

## 2016-02-25 ENCOUNTER — Encounter: Payer: Self-pay | Admitting: Family Medicine

## 2016-02-25 VITALS — BP 122/78 | HR 74 | Temp 97.6°F | Resp 18 | Ht 74.0 in | Wt 273.0 lb

## 2016-02-25 DIAGNOSIS — Z1211 Encounter for screening for malignant neoplasm of colon: Secondary | ICD-10-CM

## 2016-02-25 DIAGNOSIS — R739 Hyperglycemia, unspecified: Secondary | ICD-10-CM

## 2016-02-25 DIAGNOSIS — Z125 Encounter for screening for malignant neoplasm of prostate: Secondary | ICD-10-CM

## 2016-02-25 DIAGNOSIS — Z0000 Encounter for general adult medical examination without abnormal findings: Secondary | ICD-10-CM | POA: Diagnosis not present

## 2016-02-25 DIAGNOSIS — G629 Polyneuropathy, unspecified: Secondary | ICD-10-CM | POA: Diagnosis not present

## 2016-02-25 LAB — POCT URINALYSIS DIPSTICK
Bilirubin, UA: NEGATIVE
Glucose, UA: NEGATIVE
KETONES UA: NEGATIVE
LEUKOCYTES UA: NEGATIVE
Nitrite, UA: NEGATIVE
PH UA: 5
PROTEIN UA: NEGATIVE
RBC UA: NEGATIVE
SPEC GRAV UA: 1.025
Urobilinogen, UA: 0.2

## 2016-02-25 LAB — IFOBT (OCCULT BLOOD): IFOBT: NEGATIVE

## 2016-02-25 NOTE — Progress Notes (Signed)
Patient ID: Darrell Montes, male   DOB: 1961/06/08, 55 y.o.   MRN: [id number]       Patient: Darrell Montes, Male    DOB: [birth year], 55 y.o.   MRN: [id number] Visit Date: 02/25/2016  Today's Provider: Wilhemena Durie, MD   Chief Complaint  Patient presents with  . Annual Exam   Subjective:    Annual physical exam [name] is a 55 y.o. male who presents today for health maintenance and complete physical. He feels well. He reports exercising about 2 times a week, walking. He reports he is sleeping fairly well.  ----------------------------------------------------------------- Colonoscopy- 01/10/12 Diverticulosis, repeat 10 years  Immunization History  Administered Date(s) Administered  . Influenza Split 11/13/2013  . Influenza,inj,Quad PF,36+ Mos 10/01/2015  . Tdap 04/28/2011     Review of Systems  Constitutional: Negative.   HENT: Negative.   Eyes: Negative.   Respiratory: Negative.   Cardiovascular: Negative.   Gastrointestinal: Negative.   Endocrine: Negative.   Genitourinary: Negative.   Musculoskeletal: Negative.   Skin: Negative.   Allergic/Immunologic: Negative.   Neurological: Positive for numbness (bottoms of feet).  Hematological: Negative.   Psychiatric/Behavioral: Negative.     Social History      He  reports that he has never smoked. He has never used smokeless tobacco. He reports that he drinks about 1.5 oz of alcohol per week. He reports that he does not use illicit drugs.       Social History   Social History  . Marital Status: Single    Spouse Name: N/A  . Number of Children: N/A  . Years of Education: N/A   Occupational History  . tech support    Social History Main Topics  . Smoking status: Never Smoker   . Smokeless tobacco: Never Used  . Alcohol Use: 1.5 oz/week    3 Standard drinks or equivalent per week     Comment: socially  . Drug Use: No  . Sexual Activity: Not Asked   Other Topics Concern  . None   Social History  Narrative    Past Medical History  Diagnosis Date  . Hypertension   . Hx of blood clots      Patient Active Problem List   Diagnosis Date Noted  . Allergic rhinitis 09/19/2015  . Acute thromboembolism of deep veins of lower extremity ([place]) 09/19/2015  . Essential (primary) hypertension 09/19/2015  . Acid reflux 09/19/2015  . HLD (hyperlipidemia) 09/19/2015  . Adiposity 09/19/2015  . Obstructive apnea 09/19/2015  . Cannot sleep 09/19/2015  . Radial nerve disease 09/19/2015  . Cough 11/14/2013    Past Surgical History  Procedure Laterality Date  . Knee surgery Left     X2 scope  . Back surgery      X2 lower disc   . Upper gi endoscopy  07/11/2013    normal duodenum, benign appearing esophageal stricture x2     Family History        Family Status  Relation Status Death Age  . Mother Alive   . Father Alive   . Daughter Alive   . Daughter Alive         His family history includes Bladder Cancer in his mother; COPD in his father and mother; Hypertension in his father; Sleep apnea in his father.    No Known Allergies  Previous Medications   AMLODIPINE (NORVASC) 5 MG TABLET    TAKE 1 TABLET BY MOUTH ONCE DAILY   ASPIRIN 81 MG  TABLET    Take 81 mg by mouth daily.   DYMISTA 137-50 MCG/ACT SUSP    daily. 1 spray each nostril   MOMETASONE (ELOCON) 0.1 % CREAM    APPLY TOPICALLY TO AFFECTED AREA(S) ONCEDAILY.   MULTIPLE VITAMIN TABLET    Take by mouth.   NAPROXEN SODIUM (ALEVE) 220 MG TABLET    Take by mouth.   PANTOPRAZOLE (PROTONIX) 40 MG TABLET    TAKE 1 TABLET BY MOUTH ONCE DAILY, 30 MINUTES BEFORE THE EVENING MEAL.   TRAZODONE (DESYREL) 100 MG TABLET    Take by mouth.    Patient Care Team: Jerrol Banana., MD as PCP - General (Family Medicine)     Objective:   Vitals: BP 122/78 mmHg  Pulse 74  Temp(Src) 97.6 F (36.4 C) (Oral)  Resp 8  Ht 6\' 2"  (1.88 m)  Wt 273 lb (123.832 kg)  BMI 35.04 kg/m2   Physical Exam  Constitutional: He is oriented to  person, place, and time. He appears well-developed and well-nourished.  HENT:  Head: Normocephalic and atraumatic.  Right Ear: External ear normal.  Left Ear: External ear normal.  Nose: Nose normal.  Mouth/Throat: Oropharynx is clear and moist.  Eyes: Conjunctivae and EOM are normal. Pupils are equal, round, and reactive to light.  Neck: Normal range of motion. Neck supple.  Cardiovascular: Normal rate, regular rhythm, normal heart sounds and intact distal pulses.   Pulmonary/Chest: Effort normal and breath sounds normal.  Abdominal: Soft. Bowel sounds are normal.  Genitourinary: Rectum normal, prostate normal and penis normal.  Musculoskeletal: Normal range of motion. He exhibits edema.  Right leg no edema. Left leg trace edema.  Neurological: He is alert and oriented to person, place, and time. He has normal reflexes.  Skin: Skin is warm and dry.  Psychiatric: He has a normal mood and affect. His behavior is normal. Judgment and thought content normal.     Depression Screen PHQ 2/9 Scores 02/25/2016 10/01/2015  PHQ - 2 Score 0 0      Assessment & Plan:     Routine Health Maintenance and Physical Exam  Exercise Activities and Dietary recommendations Goals    None      Immunization History  Administered Date(s) Administered  . Influenza Split 11/13/2013  . Influenza,inj,Quad PF,36+ Mos 10/01/2015  . Tdap 04/28/2011    Health Maintenance  Topic Date Due  . Hepatitis C Screening  [birth year]  . HIV Screening  07/20/1976  . INFLUENZA VACCINE  06/29/2016  . TETANUS/TDAP  04/27/2021  . COLONOSCOPY  01/09/2022      Discussed health benefits of physical activity, and encouraged him to engage in regular exercise appropriate for his age and condition.              Obesity              Dietary changes discussed at length.              Hypertension              Hyperlipidemia I have done the exam and reviewed the above chart and it is accurate to the best of my  knowledge.  --------------------------------------------------------------------

## 2016-02-27 ENCOUNTER — Telehealth: Payer: Self-pay

## 2016-02-27 LAB — CBC WITH DIFFERENTIAL/PLATELET
BASOS: 0 %
Basophils Absolute: 0 10*3/uL (ref 0.0–0.2)
EOS (ABSOLUTE): 0.2 10*3/uL (ref 0.0–0.4)
EOS: 3 %
HEMATOCRIT: 43.2 % (ref 37.5–51.0)
HEMOGLOBIN: 14.6 g/dL (ref 12.6–17.7)
IMMATURE GRANS (ABS): 0 10*3/uL (ref 0.0–0.1)
IMMATURE GRANULOCYTES: 1 %
LYMPHS: 19 %
Lymphocytes Absolute: 1 10*3/uL (ref 0.7–3.1)
MCH: 30.6 pg (ref 26.6–33.0)
MCHC: 33.8 g/dL (ref 31.5–35.7)
MCV: 91 fL (ref 79–97)
MONOCYTES: 6 %
Monocytes Absolute: 0.3 10*3/uL (ref 0.1–0.9)
NEUTROS ABS: 3.7 10*3/uL (ref 1.4–7.0)
NEUTROS PCT: 71 %
PLATELETS: 142 10*3/uL — AB (ref 150–379)
RBC: 4.77 x10E6/uL (ref 4.14–5.80)
RDW: 14.3 % (ref 12.3–15.4)
WBC: 5.1 10*3/uL (ref 3.4–10.8)

## 2016-02-27 LAB — PSA: Prostate Specific Ag, Serum: 1.8 ng/mL (ref 0.0–4.0)

## 2016-02-27 LAB — LIPID PANEL WITH LDL/HDL RATIO
CHOLESTEROL TOTAL: 225 mg/dL — AB (ref 100–199)
HDL: 39 mg/dL — AB (ref >39)
LDL CALC: 122 mg/dL — AB (ref 0–99)
LDl/HDL Ratio: 3.1 ratio units (ref 0.0–3.6)
TRIGLYCERIDES: 319 mg/dL — AB (ref 0–149)
VLDL Cholesterol Cal: 64 mg/dL — ABNORMAL HIGH (ref 5–40)

## 2016-02-27 LAB — HEMOGLOBIN A1C
Est. average glucose Bld gHb Est-mCnc: 114 mg/dL
Hgb A1c MFr Bld: 5.6 % (ref 4.8–5.6)

## 2016-02-27 LAB — TSH: TSH: 3.01 u[IU]/mL (ref 0.450–4.500)

## 2016-02-27 LAB — VITAMIN B12: VITAMIN B 12: 316 pg/mL (ref 211–946)

## 2016-02-27 NOTE — Telephone Encounter (Signed)
-----   Message from Jerrol Banana., MD sent at 02/27/2016 12:25 PM EDT ----- Labs stable last year. Continue to work on diet and exercise.

## 2016-02-27 NOTE — Telephone Encounter (Signed)
Left message to call back  

## 2016-03-03 NOTE — Telephone Encounter (Signed)
Pt advised-aa 

## 2016-05-18 ENCOUNTER — Other Ambulatory Visit: Payer: Self-pay | Admitting: Family Medicine

## 2016-07-29 DIAGNOSIS — M7552 Bursitis of left shoulder: Secondary | ICD-10-CM | POA: Diagnosis not present

## 2016-08-24 ENCOUNTER — Ambulatory Visit (INDEPENDENT_AMBULATORY_CARE_PROVIDER_SITE_OTHER): Payer: BLUE CROSS/BLUE SHIELD | Admitting: Family Medicine

## 2016-08-24 ENCOUNTER — Encounter: Payer: Self-pay | Admitting: Family Medicine

## 2016-08-24 VITALS — BP 134/82 | HR 68 | Temp 98.0°F | Resp 14 | Wt 272.0 lb

## 2016-08-24 DIAGNOSIS — E785 Hyperlipidemia, unspecified: Secondary | ICD-10-CM | POA: Diagnosis not present

## 2016-08-24 DIAGNOSIS — I10 Essential (primary) hypertension: Secondary | ICD-10-CM | POA: Diagnosis not present

## 2016-08-24 DIAGNOSIS — Z23 Encounter for immunization: Secondary | ICD-10-CM

## 2016-08-24 DIAGNOSIS — K219 Gastro-esophageal reflux disease without esophagitis: Secondary | ICD-10-CM

## 2016-08-24 NOTE — Progress Notes (Signed)
Subjective:  HPI  Hypertension, follow-up:  BP Readings from Last 3 Encounters:  08/24/16 134/82  02/25/16 122/78  12/23/15 118/84    He was last seen for hypertension 6 months ago.  BP at that visit was 122/78. Management since that visit includes nne. He reports good compliance with treatment. He is not having side effects.  He is exercising. He is adherent to low salt diet.   Outside blood pressures are not being checked. He is experiencing none.  Patient denies chest pain, chest pressure/discomfort, claudication, dyspnea, exertional chest pressure/discomfort, fatigue, irregular heart beat, lower extremity edema, near-syncope, orthopnea, palpitations, paroxysmal nocturnal dyspnea and syncope.   Wt Readings from Last 3 Encounters:  08/24/16 272 lb (123.4 kg)  02/25/16 273 lb (123.8 kg)  12/23/15 275 lb (124.7 kg)   ------------------------------------------------------------------------  Pt reports that his GERD is doing well. "Every once in a while I will have an episode where it comes up but it is few and far between."   Prior to Admission medications   Medication Sig Start Date End Date Taking? Authorizing Provider  amLODipine (NORVASC) 5 MG tablet TAKE 1 TABLET BY MOUTH ONCE DAILY 05/18/16   Jerrol Banana., MD  aspirin 81 MG tablet Take 81 mg by mouth daily.    Historical Provider, MD  DYMISTA 137-50 MCG/ACT SUSP daily. 1 spray each nostril    Historical Provider, MD  mometasone (ELOCON) 0.1 % cream APPLY TOPICALLY TO AFFECTED AREA(S) ONCEDAILY. 11/20/15   Jerrol Banana., MD  Multiple Vitamin tablet Take by mouth.    Historical Provider, MD  naproxen sodium (ALEVE) 220 MG tablet Take by mouth.    Historical Provider, MD  pantoprazole (PROTONIX) 40 MG tablet TAKE 1 TABLET BY MOUTH ONCE DAILY, 30 MINUTES BEFORE THE EVENING MEAL. 08/14/15   Richard Maceo Pro., MD  traZODone (DESYREL) 100 MG tablet Take by mouth. 02/03/15   Historical Provider, MD     Patient Active Problem List   Diagnosis Date Noted  . Allergic rhinitis 09/19/2015  . Acute thromboembolism of deep veins of lower extremity ([place]) 09/19/2015  . Essential (primary) hypertension 09/19/2015  . Acid reflux 09/19/2015  . HLD (hyperlipidemia) 09/19/2015  . Adiposity 09/19/2015  . Obstructive apnea 09/19/2015  . Cannot sleep 09/19/2015  . Radial nerve disease 09/19/2015  . Cough 11/14/2013    Past Medical History:  Diagnosis Date  . Hx of blood clots   . Hypertension     Social History   Social History  . Marital status: Single    Spouse name: N/A  . Number of children: N/A  . Years of education: N/A   Occupational History  . tech support    Social History Main Topics  . Smoking status: Never Smoker  . Smokeless tobacco: Never Used  . Alcohol use 1.5 oz/week    3 Standard drinks or equivalent per week     Comment: socially  . Drug use: No  . Sexual activity: Not on file   Other Topics Concern  . Not on file   Social History Narrative  . No narrative on file    No Known Allergies  Review of Systems  Constitutional: Negative.   HENT: Negative.        Post nasal drainage  Eyes: Negative.   Respiratory: Negative.   Cardiovascular: Negative.   Gastrointestinal: Negative.   Genitourinary: Negative.   Musculoskeletal: Negative.   Skin: Negative.   Neurological: Negative.   Endo/Heme/Allergies: Negative.  Psychiatric/Behavioral: Negative.     Immunization History  Administered Date(s) Administered  . Influenza Split 11/13/2013  . Influenza,inj,Quad PF,36+ Mos 10/01/2015  . Tdap 04/28/2011   Objective:  BP 134/82 (BP Location: Left Arm, Patient Position: Sitting, Cuff Size: Large)   Pulse 68   Temp 98 F (36.7 C) (Oral)   Resp 14   Wt 272 lb (123.4 kg)   BMI 34.92 kg/m   Physical Exam  Constitutional: He is oriented to person, place, and time and well-developed, well-nourished, and in no distress.  HENT:  Head: Normocephalic  and atraumatic.  Right Ear: External ear normal.  Left Ear: External ear normal.  Nose: Nose normal.  Eyes: Conjunctivae are normal.  Neck: Neck supple.  Cardiovascular: Normal rate, regular rhythm and normal heart sounds.   Pulmonary/Chest: Effort normal and breath sounds normal.  Abdominal: Soft.  Neurological: He is alert and oriented to person, place, and time.  Skin: Skin is warm and dry.  Psychiatric: Mood, memory, affect and judgment normal.    Lab Results  Component Value Date   WBC 5.1 02/26/2016   HGB 14.6 02/04/2015   HCT 43.2 02/26/2016   PLT 142 (L) 02/26/2016   CHOL 225 (H) 02/26/2016   TRIG 319 (H) 02/26/2016   HDL 39 (L) 02/26/2016   LDLCALC 122 (H) 02/26/2016   TSH 3.010 02/26/2016   PSA 2.2 04/02/2015   HGBA1C 5.6 02/26/2016    CMP     Component Value Date/Time   NA 142 02/04/2015   K 4.9 02/04/2015   BUN 15 02/04/2015   CREATININE 0.7 02/04/2015   AST 27 02/04/2015   ALT 28 02/04/2015   ALKPHOS 105 02/04/2015    Assessment and Plan :  1. Essential (primary) hypertension   2. HLD (hyperlipidemia)   3. Gastroesophageal reflux disease, esophagitis presence not specified   4. Need for influenza vaccination  - Flu Vaccine QUAD 36+ mos IM 5.Obesity Diet and exercise stressed. I have done the exam and reviewed the above chart and it is accurate to the best of my knowledge.  Miguel Aschoff MD [facility] 08/24/2016 8:30 AM

## 2016-08-26 ENCOUNTER — Other Ambulatory Visit: Payer: Self-pay | Admitting: Family Medicine

## 2016-08-30 ENCOUNTER — Other Ambulatory Visit: Payer: Self-pay | Admitting: Family Medicine

## 2016-09-29 ENCOUNTER — Encounter: Payer: Self-pay | Admitting: Family Medicine

## 2016-09-29 ENCOUNTER — Ambulatory Visit (INDEPENDENT_AMBULATORY_CARE_PROVIDER_SITE_OTHER): Payer: BLUE CROSS/BLUE SHIELD | Admitting: Family Medicine

## 2016-09-29 VITALS — BP 106/78 | HR 84 | Temp 98.5°F | Resp 16 | Wt 271.0 lb

## 2016-09-29 DIAGNOSIS — M25512 Pain in left shoulder: Secondary | ICD-10-CM | POA: Diagnosis not present

## 2016-09-29 DIAGNOSIS — R05 Cough: Secondary | ICD-10-CM | POA: Diagnosis not present

## 2016-09-29 DIAGNOSIS — R059 Cough, unspecified: Secondary | ICD-10-CM

## 2016-09-29 MED ORDER — NAPROXEN 500 MG PO TABS: 500.0000 mg | ORAL_TABLET | Freq: Two times a day (BID) | ORAL | 0 refills | Status: DC

## 2016-09-29 MED ORDER — HYDROCOD POLST-CPM POLST ER 10-8 MG/5ML PO SUER: 5.0000 mL | Freq: Every evening | ORAL | 0 refills | Status: DC | PRN

## 2016-09-29 NOTE — Progress Notes (Signed)
Darrell Montes  MRN: [id number] DOB: 1961/03/29  Subjective:  HPI   Patient states he has had a constant cough for about 1 1/2 weeks now. Productive with mainly clear phlegm. Cough affects his sleep. He feels some chest congestion and tightness, post nasal drainage. Denies any other symptoms. He has been taking Mucinnex liquid and tablet. Also wanted for his left shoulder to be looked at, he had cortisone injection about 2 months ago and it helped but the pain is coming back and he is not sure if the shot is wearing off or something else is causing this issue. Orthopedic at [facility] did the injection. He was told this was probably bursitis issue. He has some decreased range of motion with left arm.  Patient Active Problem List   Diagnosis Date Noted  . Allergic rhinitis 09/19/2015  . Acute thromboembolism of deep veins of lower extremity ([place]) 09/19/2015  . Essential (primary) hypertension 09/19/2015  . Acid reflux 09/19/2015  . HLD (hyperlipidemia) 09/19/2015  . Adiposity 09/19/2015  . Obstructive apnea 09/19/2015  . Cannot sleep 09/19/2015  . Radial nerve disease 09/19/2015  . Cough 11/14/2013    Past Medical History:  Diagnosis Date  . Hx of blood clots   . Hypertension     Social History   Social History  . Marital status: Single    Spouse name: N/A  . Number of children: N/A  . Years of education: N/A   Occupational History  . tech support    Social History Main Topics  . Smoking status: Never Smoker  . Smokeless tobacco: Never Used  . Alcohol use 1.5 oz/week    3 Standard drinks or equivalent per week     Comment: socially  . Drug use: No  . Sexual activity: Not on file   Other Topics Concern  . Not on file   Social History Narrative  . No narrative on file    Outpatient Encounter Prescriptions as of 09/29/2016  Medication Sig Note  . amLODipine (NORVASC) 5 MG tablet TAKE 1 TABLET BY MOUTH ONCE DAILY   . aspirin 81 MG tablet Take 81 mg by mouth  daily.   . Multiple Vitamin tablet Take by mouth. 09/19/2015: Received from: [facility]  . naproxen sodium (ALEVE) 220 MG tablet Take by mouth. 09/19/2015: Received from: [facility]  . pantoprazole (PROTONIX) 40 MG tablet TAKE 1 TABLET BY MOUTH ONCE DAILY 30 MINUTES BEFORE EVENING MEAL   . traZODone (DESYREL) 100 MG tablet TAKE ONE TABLET BY MOUTH AT BEDTIME.   . [DISCONTINUED] mometasone (ELOCON) 0.1 % cream APPLY TOPICALLY TO AFFECTED AREA(S) ONCEDAILY.    No facility-administered encounter medications on file as of 09/29/2016.     No Known Allergies  Review of Systems  Constitutional: Negative.   HENT: Positive for congestion.        Post nasal drainage  Eyes: Negative.   Respiratory: Positive for cough and sputum production. Negative for shortness of breath.   Cardiovascular: Negative.        Chest congestion/tightness  Gastrointestinal: Negative.   Musculoskeletal: Positive for joint pain (left shoulder).  Endo/Heme/Allergies: Negative.   Psychiatric/Behavioral: Negative.    Objective:  BP 106/78   Pulse 84   Temp 98.5 F (36.9 C)   Resp 16   Wt 271 lb (122.9 kg)   SpO2 98%   BMI 34.79 kg/m   Physical Exam  Constitutional: He is oriented to person, place, and time and well-developed, well-nourished, and  in no distress.  HENT:  Head: Normocephalic and atraumatic.  Right Ear: External ear normal.  Left Ear: External ear normal.  Mouth/Throat: Oropharynx is clear and moist.  Eyes: Conjunctivae are normal. Pupils are equal, round, and reactive to light.  Neck: Normal range of motion. Neck supple.  Cardiovascular: Normal rate, regular rhythm, normal heart sounds and intact distal pulses.  Exam reveals no gallop.   No murmur heard. Pulmonary/Chest: Effort normal and breath sounds normal. No respiratory distress. He has no wheezes.  Abdominal: Soft.  Musculoskeletal:  Torn bicep on the left-this has been present since he was around  55 years of age per patient. Discomfort abduction of the left shoulder at 90 degrees.  Neurological: He is alert and oriented to person, place, and time.  Skin: Skin is warm and dry.  Psychiatric: Mood, memory, affect and judgment normal.    Assessment and Plan :  1. Acute pain of left shoulder Will provide Naproxen to use as needed. Advised patient to get in contact with orthopedic again and reassess this issue. Shoulder arthropathy certainly present. This just inflammatory versus a rotator cuff tear or arthritis.  2. Cough Try Tussionex. Push fluids, continue using Mucinex. Do not need antibiotic at this time in my opinion. If symptoms continue or get worse may need imaging. Follow as needed. 3.URI  HPI, Exam and A&P transcribed under direction and in the presence of Miguel Aschoff, MD. I have done the exam and reviewed the chart and it is accurate to the best of my knowledge. Miguel Aschoff M.D. [facility]

## 2016-12-09 DIAGNOSIS — M7552 Bursitis of left shoulder: Secondary | ICD-10-CM | POA: Diagnosis not present

## 2016-12-14 DIAGNOSIS — M7552 Bursitis of left shoulder: Secondary | ICD-10-CM | POA: Diagnosis not present

## 2016-12-14 DIAGNOSIS — M6281 Muscle weakness (generalized): Secondary | ICD-10-CM | POA: Diagnosis not present

## 2016-12-14 DIAGNOSIS — M25512 Pain in left shoulder: Secondary | ICD-10-CM | POA: Diagnosis not present

## 2016-12-14 DIAGNOSIS — M25612 Stiffness of left shoulder, not elsewhere classified: Secondary | ICD-10-CM | POA: Diagnosis not present

## 2016-12-20 ENCOUNTER — Ambulatory Visit
Admission: RE | Admit: 2016-12-20 | Discharge: 2016-12-20 | Disposition: A | Discharge status: Home / Self Care | Payer: BLUE CROSS/BLUE SHIELD | Source: Ambulatory Visit | Attending: Family Medicine | Admitting: Family Medicine

## 2016-12-20 ENCOUNTER — Encounter: Payer: Self-pay | Admitting: Family Medicine

## 2016-12-20 ENCOUNTER — Ambulatory Visit (INDEPENDENT_AMBULATORY_CARE_PROVIDER_SITE_OTHER): Payer: BLUE CROSS/BLUE SHIELD | Admitting: Family Medicine

## 2016-12-20 VITALS — BP 120/84 | HR 84 | Temp 98.5°F | Resp 16 | Wt 264.0 lb

## 2016-12-20 DIAGNOSIS — H9201 Otalgia, right ear: Secondary | ICD-10-CM

## 2016-12-20 DIAGNOSIS — R05 Cough: Secondary | ICD-10-CM

## 2016-12-20 DIAGNOSIS — R053 Chronic cough: Secondary | ICD-10-CM

## 2016-12-20 MED ORDER — MONTELUKAST SODIUM 10 MG PO TABS: 10.0000 mg | ORAL_TABLET | Freq: Every day | ORAL | 3 refills | Status: DC

## 2016-12-20 NOTE — Progress Notes (Signed)
Patient: Darrell Montes Male    DOB: November 08, 1961   56 y.o.   MRN: [id number] Visit Date: 12/20/2016  Today's Provider: Lelon Huh, MD   Chief Complaint  Patient presents with  . Cough   Subjective:    Patient has had a cough for several months. Patient stated that in the cough has worsened in the last three weeks. Symptoms of productive cough (clear mucous), wheezing in chest, and right ear congestion. Patient has been taking mucinex and delsym otc with mild relief.    Cough  This is a recurrent problem. The current episode started more than 1 month ago (several months). The cough is productive of sputum. Associated symptoms include ear congestion and wheezing. Pertinent negatives include no chest pain, chills, ear pain, fever, headaches, heartburn, hemoptysis, myalgias, nasal congestion, postnasal drip, rash, rhinorrhea, sore throat, shortness of breath, sweats or weight loss. Nothing aggravates the symptoms. Treatments tried: mucinex and delsym. The treatment provided mild relief. There is no history of asthma, bronchiectasis, bronchitis, COPD, emphysema, environmental allergies or pneumonia.   Feels a little tickle in throat, but sometimes feels congestion in chest. Sometimes wakes him up in the middle of the night.   Has history of GERD associated with heartburn which has been under complete control since taking pantoprazole.     No Known Allergies   Current Outpatient Prescriptions:  .  amLODipine (NORVASC) 5 MG tablet, TAKE 1 TABLET BY MOUTH ONCE DAILY, Disp: 30 tablet, Rfl: 12 .  aspirin 81 MG tablet, Take 81 mg by mouth daily., Disp: , Rfl:  .  chlorpheniramine-HYDROcodone (TUSSIONEX PENNKINETIC ER) 10-8 MG/5ML SUER, Take 5 mLs by mouth at bedtime as needed for cough., Disp: 140 mL, Rfl: 0 .  Multiple Vitamin tablet, Take by mouth., Disp: , Rfl:  .  naproxen (NAPROSYN) 500 MG tablet, Take 1 tablet (500 mg total) by mouth 2 (two) times daily with a meal., Disp: 60  tablet, Rfl: 0 .  naproxen sodium (ALEVE) 220 MG tablet, Take by mouth., Disp: , Rfl:  .  pantoprazole (PROTONIX) 40 MG tablet, TAKE 1 TABLET BY MOUTH ONCE DAILY 30 MINUTES BEFORE EVENING MEAL, Disp: 30 tablet, Rfl: 12 .  traZODone (DESYREL) 100 MG tablet, TAKE ONE TABLET BY MOUTH AT BEDTIME., Disp: 30 tablet, Rfl: 11  Review of Systems  Constitutional: Negative for appetite change, chills, fever and weight loss.  HENT: Positive for congestion. Negative for ear pain, postnasal drip, rhinorrhea and sore throat.   Respiratory: Positive for cough and wheezing. Negative for hemoptysis, chest tightness and shortness of breath.   Cardiovascular: Negative for chest pain and palpitations.  Gastrointestinal: Negative for abdominal pain, heartburn, nausea and vomiting.  Musculoskeletal: Negative for myalgias.  Skin: Negative for rash.  Allergic/Immunologic: Negative for environmental allergies.  Neurological: Negative for headaches.    Social History  Substance Use Topics  . Smoking status: Never Smoker  . Smokeless tobacco: Never Used  . Alcohol use 1.5 oz/week    3 Standard drinks or equivalent per week     Comment: socially   Objective:   BP 120/84 (BP Location: Right Arm, Patient Position: Sitting, Cuff Size: Large)   Pulse 84   Temp 98.5 F (36.9 C) (Oral)   Resp 16   Wt 264 lb (119.7 kg)   SpO2 96%   BMI 33.90 kg/m   Physical Exam  General Appearance:    Alert, cooperative, no distress  HENT:   bilateral TM normal without fluid  or infection, bilateral TM fluid noted, neck without nodes, sinuses nontender, post nasal drip noted and nasal mucosa pale and congested  Eyes:    PERRL, conjunctiva/corneas clear, EOM's intact       Lungs:     Clear to auscultation bilaterally, respirations unlabored  Heart:    Regular rate and rhythm  Neurologic:   Awake, alert, oriented x 3. No apparent focal neurological           defect.           Assessment & Plan:     1. Chronic  cough Reviewed Ddx common cough. PNDr most likely cause. Try Singular. Get Chest XR.  - DG Chest 2 View; Future  2. Ear pain Probably eustachian tube dysfunction. Doesn't appear acutely infected, but consider course of antibiotic if not improving on Singulair.     The entirety of the information documented in the History of Present Illness, Review of Systems and Physical Exam were personally obtained by me. Portions of this information were initially documented by Theressa Millard, CMA and reviewed by me for thoroughness and accuracy.    Lelon Huh, MD  [facility]

## 2016-12-20 NOTE — Patient Instructions (Signed)
Go to the [facility] on [address] for chest Xray  

## 2016-12-21 DIAGNOSIS — M25612 Stiffness of left shoulder, not elsewhere classified: Secondary | ICD-10-CM | POA: Diagnosis not present

## 2016-12-21 DIAGNOSIS — M25512 Pain in left shoulder: Secondary | ICD-10-CM | POA: Diagnosis not present

## 2016-12-21 DIAGNOSIS — M6281 Muscle weakness (generalized): Secondary | ICD-10-CM | POA: Diagnosis not present

## 2016-12-21 DIAGNOSIS — M7552 Bursitis of left shoulder: Secondary | ICD-10-CM | POA: Diagnosis not present

## 2016-12-23 DIAGNOSIS — M25512 Pain in left shoulder: Secondary | ICD-10-CM | POA: Diagnosis not present

## 2016-12-23 DIAGNOSIS — M6281 Muscle weakness (generalized): Secondary | ICD-10-CM | POA: Diagnosis not present

## 2016-12-23 DIAGNOSIS — M25612 Stiffness of left shoulder, not elsewhere classified: Secondary | ICD-10-CM | POA: Diagnosis not present

## 2016-12-23 DIAGNOSIS — M7552 Bursitis of left shoulder: Secondary | ICD-10-CM | POA: Diagnosis not present

## 2016-12-29 ENCOUNTER — Other Ambulatory Visit: Payer: Self-pay | Admitting: Orthopedic Surgery

## 2016-12-29 DIAGNOSIS — S46092D Other injury of muscle(s) and tendon(s) of the rotator cuff of left shoulder, subsequent encounter: Secondary | ICD-10-CM

## 2016-12-29 DIAGNOSIS — M25512 Pain in left shoulder: Secondary | ICD-10-CM | POA: Diagnosis not present

## 2017-01-07 ENCOUNTER — Ambulatory Visit: Payer: BLUE CROSS/BLUE SHIELD

## 2017-01-31 ENCOUNTER — Other Ambulatory Visit: Payer: Self-pay | Admitting: Orthopedic Surgery

## 2017-01-31 DIAGNOSIS — M25512 Pain in left shoulder: Secondary | ICD-10-CM

## 2017-01-31 DIAGNOSIS — S46092D Other injury of muscle(s) and tendon(s) of the rotator cuff of left shoulder, subsequent encounter: Secondary | ICD-10-CM

## 2017-02-16 ENCOUNTER — Ambulatory Visit
Admission: RE | Admit: 2017-02-16 | Discharge: 2017-02-16 | Disposition: A | Discharge status: Home / Self Care | Payer: BLUE CROSS/BLUE SHIELD | Source: Ambulatory Visit | Attending: Orthopedic Surgery | Admitting: Orthopedic Surgery

## 2017-02-16 DIAGNOSIS — M25512 Pain in left shoulder: Secondary | ICD-10-CM

## 2017-02-16 DIAGNOSIS — M659 Synovitis and tenosynovitis, unspecified: Secondary | ICD-10-CM | POA: Diagnosis not present

## 2017-02-16 DIAGNOSIS — S46012A Strain of muscle(s) and tendon(s) of the rotator cuff of left shoulder, initial encounter: Secondary | ICD-10-CM | POA: Diagnosis not present

## 2017-02-16 DIAGNOSIS — S46092D Other injury of muscle(s) and tendon(s) of the rotator cuff of left shoulder, subsequent encounter: Secondary | ICD-10-CM

## 2017-02-16 DIAGNOSIS — M75102 Unspecified rotator cuff tear or rupture of left shoulder, not specified as traumatic: Secondary | ICD-10-CM | POA: Diagnosis not present

## 2017-03-07 ENCOUNTER — Encounter: Payer: Self-pay | Admitting: Family Medicine

## 2017-03-07 ENCOUNTER — Ambulatory Visit (INDEPENDENT_AMBULATORY_CARE_PROVIDER_SITE_OTHER): Payer: BLUE CROSS/BLUE SHIELD | Admitting: Family Medicine

## 2017-03-07 VITALS — BP 118/72 | HR 72 | Temp 98.4°F | Resp 16 | Ht 73.75 in | Wt 265.0 lb

## 2017-03-07 DIAGNOSIS — Z0000 Encounter for general adult medical examination without abnormal findings: Secondary | ICD-10-CM | POA: Diagnosis not present

## 2017-03-07 DIAGNOSIS — G4709 Other insomnia: Secondary | ICD-10-CM

## 2017-03-07 DIAGNOSIS — Z1211 Encounter for screening for malignant neoplasm of colon: Secondary | ICD-10-CM

## 2017-03-07 DIAGNOSIS — Z125 Encounter for screening for malignant neoplasm of prostate: Secondary | ICD-10-CM | POA: Diagnosis not present

## 2017-03-07 DIAGNOSIS — Z1159 Encounter for screening for other viral diseases: Secondary | ICD-10-CM

## 2017-03-07 DIAGNOSIS — K219 Gastro-esophageal reflux disease without esophagitis: Secondary | ICD-10-CM | POA: Diagnosis not present

## 2017-03-07 DIAGNOSIS — J301 Allergic rhinitis due to pollen: Secondary | ICD-10-CM

## 2017-03-07 LAB — POCT URINALYSIS DIPSTICK
Bilirubin, UA: NEGATIVE
Blood, UA: NEGATIVE
Glucose, UA: NEGATIVE
KETONES UA: NEGATIVE
LEUKOCYTES UA: NEGATIVE
NITRITE UA: NEGATIVE
Protein, UA: NEGATIVE
Spec Grav, UA: 1.01 (ref 1.030–1.035)
Urobilinogen, UA: 0.2 (ref ?–2.0)
pH, UA: 6.5 (ref 5.0–8.0)

## 2017-03-07 LAB — IFOBT (OCCULT BLOOD): IFOBT: NEGATIVE

## 2017-03-07 MED ORDER — LORATADINE 10 MG PO TABS: 10.0000 mg | ORAL_TABLET | Freq: Every day | ORAL | 3 refills | Status: AC

## 2017-03-07 NOTE — Progress Notes (Signed)
Patient: Darrell Blossom., Male    DOB: February 21, 1961, 56 y.o.   MRN: [id number] Visit Date: 03/07/2017  Today's Provider: Wilhemena Durie, MD   Chief Complaint  Patient presents with  . Annual Exam   Subjective:  Darrell Pranish Akhavan. is a 56 y.o. male who presents today for health maintenance and complete physical. He feels well. He reports exercising walking about 4 to 5 days a week about 1 1/2 miles each time-takes him about 20 to 30 minutes to reach that. He reports he is sleeping well has to take Trazodone about 1 to 2 times a month. He has noticed that he is having allergies as he is getting older in the form of skin itching. Last night for example he developed this after been outside for a while and had to take Benadryl which helped. He also takes Singulair in the evenings. Immunization History  Administered Date(s) Administered  . Influenza Split 11/13/2013  . Influenza,inj,Quad PF,36+ Mos 10/01/2015, 08/24/2016  . Tdap 04/28/2011   Last colonoscopy was 01/12/12 diverticulosis, otherwise normal repeat in 10 years-2023.  Review of Systems  Constitutional: Negative.   HENT: Negative.   Eyes: Negative.   Respiratory: Negative.   Cardiovascular: Negative.   Gastrointestinal: Negative.   Endocrine: Negative.   Genitourinary: Positive for frequency.  Musculoskeletal: Negative.   Skin:       itching  Allergic/Immunologic: Positive for environmental allergies.  Neurological: Negative.   Hematological: Negative.   Psychiatric/Behavioral: Negative.     Social History   Social History  . Marital status: Single    Spouse name: N/A  . Number of children: N/A  . Years of education: N/A   Occupational History  . tech support    Social History Main Topics  . Smoking status: Never Smoker  . Smokeless tobacco: Never Used  . Alcohol use 1.5 oz/week    3 Standard drinks or equivalent per week     Comment: socially  . Drug use: No  . Sexual activity: Not on file   Other Topics  Concern  . Not on file   Social History Narrative  . No narrative on file    Patient Active Problem List   Diagnosis Date Noted  . Allergic rhinitis 09/19/2015  . Acute thromboembolism of deep veins of lower extremity ([place]) 09/19/2015  . Essential (primary) hypertension 09/19/2015  . Acid reflux 09/19/2015  . HLD (hyperlipidemia) 09/19/2015  . Adiposity 09/19/2015  . Obstructive apnea 09/19/2015  . Cannot sleep 09/19/2015  . Radial nerve disease 09/19/2015  . Cough 11/14/2013    Past Surgical History:  Procedure Laterality Date  . BACK SURGERY     X2 lower disc   . KNEE SURGERY Left    X2 scope  . UPPER GI ENDOSCOPY  07/11/2013   normal duodenum, benign appearing esophageal stricture x2     His family history includes Bladder Cancer in his mother; COPD in his father and mother; Hypertension in his father; Sleep apnea in his father.     Outpatient Encounter Prescriptions as of 03/07/2017  Medication Sig Note  . amLODipine (NORVASC) 5 MG tablet TAKE 1 TABLET BY MOUTH ONCE DAILY   . aspirin 81 MG tablet Take 81 mg by mouth daily.   . montelukast (SINGULAIR) 10 MG tablet Take 1 tablet (10 mg total) by mouth at bedtime.   . Multiple Vitamin tablet Take by mouth. 09/19/2015: Received from: [facility]  . naproxen (NAPROSYN) 500 MG tablet Take 1 tablet (  500 mg total) by mouth 2 (two) times daily with a meal.   . naproxen sodium (ALEVE) 220 MG tablet Take by mouth. 09/19/2015: Received from: [facility]  . pantoprazole (PROTONIX) 40 MG tablet TAKE 1 TABLET BY MOUTH ONCE DAILY 30 MINUTES BEFORE EVENING MEAL   . traZODone (DESYREL) 100 MG tablet TAKE ONE TABLET BY MOUTH AT BEDTIME.   . [DISCONTINUED] chlorpheniramine-HYDROcodone (TUSSIONEX PENNKINETIC ER) 10-8 MG/5ML SUER Take 5 mLs by mouth at bedtime as needed for cough.    No facility-administered encounter medications on file as of 03/07/2017.     Patient Care Team: Jerrol Banana., MD  as PCP - General (Family Medicine)      Objective:   Vitals:  Vitals:   03/07/17 0923  BP: 118/72  Pulse: 72  Resp: 16  Temp: 98.4 F (36.9 C)  Weight: 265 lb (120.2 kg)  Height: 6' 1.75" (1.873 m)    Physical Exam  Constitutional: He is oriented to person, place, and time. He appears well-developed and well-nourished.  HENT:  Head: Normocephalic and atraumatic.  Right Ear: External ear normal.  Left Ear: External ear normal.  Eyes: Conjunctivae are normal. Pupils are equal, round, and reactive to light.  Neck: Normal range of motion. Neck supple.  Cardiovascular: Normal rate, regular rhythm, normal heart sounds and intact distal pulses.  Exam reveals no gallop.   No murmur heard. Pulmonary/Chest: Effort normal and breath sounds normal. No respiratory distress. He has no wheezes.  Abdominal: Soft. Bowel sounds are normal. He exhibits no distension. There is no tenderness. There is no rebound.  Genitourinary: Rectum normal, prostate normal and penis normal. Rectal exam shows guaiac negative stool. No penile tenderness.  Musculoskeletal: Normal range of motion. He exhibits edema. He exhibits no tenderness.  Trace.  Neurological: He is alert and oriented to person, place, and time. No cranial nerve deficit. Coordination normal.  Skin: Skin is warm. No rash noted. No erythema.  Psychiatric: He has a normal mood and affect. His behavior is normal. Judgment and thought content normal.   Depression Screen PHQ 2/9 Scores 03/07/2017 02/25/2016 10/01/2015  PHQ - 2 Score 0 0 0  PHQ- 9 Score 0 - -    Assessment & Plan:    1. Annual physical exam - CBC w/Diff/Platelet - Comprehensive metabolic panel - Lipid Panel With LDL/HDL Ratio - TSH - POCT Urinalysis Dipstick - IFOBT POC (occult bld, rslt in office)  2. Colon cancer screening  3. Prostate cancer screening - PSA  4. Chronic seasonal allergic rhinitis due to pollen Add Loratadine and continue Singulair. Follow.  5. Other  insomnia Stable. Declines CPAP.  6. Gastroesophageal reflux disease, esophagitis presence not specified Stable.  7. Need for hepatitis C screening test - Hepatitis C Antibody 8.OSA Pt declines CPAP. 9.LeftShoulder Inmpingement HPI, Exam and A&P transcribed under direction and in the presence of Miguel Aschoff, MD.  Discussed health benefits of physical activity, and encouraged him to engage in regular exercise appropriate for his age and condition.   I have done the exam and reviewed the chart and it is accurate to the best of my knowledge. Development worker, community has been used and  any errors in dictation or transcription are unintentional. Miguel Aschoff M.D. [facility]

## 2017-03-08 DIAGNOSIS — Z1159 Encounter for screening for other viral diseases: Secondary | ICD-10-CM | POA: Diagnosis not present

## 2017-03-08 DIAGNOSIS — Z0000 Encounter for general adult medical examination without abnormal findings: Secondary | ICD-10-CM | POA: Diagnosis not present

## 2017-03-08 DIAGNOSIS — Z125 Encounter for screening for malignant neoplasm of prostate: Secondary | ICD-10-CM | POA: Diagnosis not present

## 2017-03-09 LAB — CBC WITH DIFFERENTIAL/PLATELET
Basophils Absolute: 0 10*3/uL (ref 0.0–0.2)
Basos: 1 %
EOS (ABSOLUTE): 0.2 10*3/uL (ref 0.0–0.4)
Eos: 3 %
Hematocrit: 44 % (ref 37.5–51.0)
Hemoglobin: 15 g/dL (ref 13.0–17.7)
IMMATURE GRANULOCYTES: 1 %
Immature Grans (Abs): 0 10*3/uL (ref 0.0–0.1)
Lymphocytes Absolute: 0.9 10*3/uL (ref 0.7–3.1)
Lymphs: 15 %
MCH: 31.1 pg (ref 26.6–33.0)
MCHC: 34.1 g/dL (ref 31.5–35.7)
MCV: 91 fL (ref 79–97)
MONOS ABS: 0.4 10*3/uL (ref 0.1–0.9)
Monocytes: 6 %
NEUTROS PCT: 74 %
Neutrophils Absolute: 4.5 10*3/uL (ref 1.4–7.0)
PLATELETS: 153 10*3/uL (ref 150–379)
RBC: 4.82 x10E6/uL (ref 4.14–5.80)
RDW: 13.8 % (ref 12.3–15.4)
WBC: 6 10*3/uL (ref 3.4–10.8)

## 2017-03-09 LAB — HEPATITIS C ANTIBODY

## 2017-03-09 LAB — COMPREHENSIVE METABOLIC PANEL
ALK PHOS: 93 IU/L (ref 39–117)
ALT: 36 IU/L (ref 0–44)
AST: 32 IU/L (ref 0–40)
Albumin/Globulin Ratio: 2 (ref 1.2–2.2)
Albumin: 4.5 g/dL (ref 3.5–5.5)
BUN/Creatinine Ratio: 22 — ABNORMAL HIGH (ref 9–20)
BUN: 15 mg/dL (ref 6–24)
Bilirubin Total: 0.4 mg/dL (ref 0.0–1.2)
CALCIUM: 9.2 mg/dL (ref 8.7–10.2)
CO2: 27 mmol/L (ref 18–29)
CREATININE: 0.69 mg/dL — AB (ref 0.76–1.27)
Chloride: 99 mmol/L (ref 96–106)
GFR calc Af Amer: 124 mL/min/{1.73_m2} (ref >59)
GFR, EST NON AFRICAN AMERICAN: 107 mL/min/{1.73_m2} (ref >59)
GLOBULIN, TOTAL: 2.2 g/dL (ref 1.5–4.5)
GLUCOSE: 101 mg/dL — AB (ref 65–99)
Potassium: 4.5 mmol/L (ref 3.5–5.2)
Sodium: 142 mmol/L (ref 134–144)
Total Protein: 6.7 g/dL (ref 6.0–8.5)

## 2017-03-09 LAB — LIPID PANEL WITH LDL/HDL RATIO
CHOLESTEROL TOTAL: 221 mg/dL — AB (ref 100–199)
HDL: 42 mg/dL (ref >39)
LDL CALC: 126 mg/dL — AB (ref 0–99)
LDl/HDL Ratio: 3 ratio (ref 0.0–3.6)
TRIGLYCERIDES: 267 mg/dL — AB (ref 0–149)
VLDL CHOLESTEROL CAL: 53 mg/dL — AB (ref 5–40)

## 2017-03-09 LAB — PSA: Prostate Specific Ag, Serum: 1.7 ng/mL (ref 0.0–4.0)

## 2017-03-09 LAB — TSH: TSH: 3.32 u[IU]/mL (ref 0.450–4.500)

## 2017-03-11 DIAGNOSIS — M7542 Impingement syndrome of left shoulder: Secondary | ICD-10-CM | POA: Diagnosis not present

## 2017-06-10 ENCOUNTER — Other Ambulatory Visit: Payer: Self-pay | Admitting: Family Medicine

## 2017-06-10 DIAGNOSIS — R053 Chronic cough: Secondary | ICD-10-CM

## 2017-06-10 DIAGNOSIS — R05 Cough: Secondary | ICD-10-CM

## 2017-09-06 ENCOUNTER — Ambulatory Visit (INDEPENDENT_AMBULATORY_CARE_PROVIDER_SITE_OTHER): Payer: BLUE CROSS/BLUE SHIELD | Admitting: Family Medicine

## 2017-09-06 VITALS — BP 132/84 | HR 70 | Temp 97.7°F | Resp 16 | Wt 272.0 lb

## 2017-09-06 DIAGNOSIS — Z23 Encounter for immunization: Secondary | ICD-10-CM

## 2017-09-06 DIAGNOSIS — E785 Hyperlipidemia, unspecified: Secondary | ICD-10-CM

## 2017-09-06 DIAGNOSIS — G4709 Other insomnia: Secondary | ICD-10-CM | POA: Diagnosis not present

## 2017-09-06 DIAGNOSIS — K219 Gastro-esophageal reflux disease without esophagitis: Secondary | ICD-10-CM

## 2017-09-06 DIAGNOSIS — I10 Essential (primary) hypertension: Secondary | ICD-10-CM | POA: Diagnosis not present

## 2017-09-06 NOTE — Progress Notes (Signed)
Darrell Montes.  MRN: [id number] DOB: Aug 18, 1961  Subjective:  HPI   The patient is a 56 year old male who presents today for 6 month follow up of chronic disease.  He was last seen on 03/07/17.  He states he has been doing well and has not questions or complaints today.  He is fasting in the event he needs any labs done today.  1. Essential (primary) hypertension The patient does not check his blood pressure outside of the office.  He is currently taking Amlodipine 5 mg and reports no adverse effects.    2. Gastroesophageal reflux disease, esophagitis presence not specified The patient is on Pantoprazole and states his symptoms have been controlled.  He does report one event about 1 month ago of symptoms of heartburn at night for 2 nights in a row.  He had taken his medication on those days and states he does not know the last time he had symptoms prior to that.  He has never tried coming off of the medication.  3. Hyperlipidemia, unspecified hyperlipidemia type The patient is not currently on any cholesterol lowering medication.  He is not sure if he has been in the past.   Lab Results  Component Value Date   CHOL 221 (H) 03/08/2017   CHOL 225 (H) 02/26/2016   CHOL 229 (A) 02/04/2015   Lab Results  Component Value Date   HDL 42 03/08/2017   HDL 39 (L) 02/26/2016   HDL 42 02/04/2015   Lab Results  Component Value Date   LDLCALC 126 (H) 03/08/2017   LDLCALC 122 (H) 02/26/2016   LDLCALC 133 02/04/2015   Lab Results  Component Value Date   TRIG 267 (H) 03/08/2017   TRIG 319 (H) 02/26/2016   TRIG 272 (A) 02/04/2015   No results found for: CHOLHDL No results found for: LDLDIRECT  4. Other insomnia The patient uses Trazodone for sleep.  He reports only needing to use it once every couple of weeks.  When he does have problems he has trouble staying asleep and not falling asleep.  He states that if he has a night where he had trouble he will usually take it the next night to  assure a better sleep.  Patient wishes to have flu shot today.   Patient Active Problem List   Diagnosis Date Noted  . Allergic rhinitis 09/19/2015  . Acute thromboembolism of deep veins of lower extremity ([place]) 09/19/2015  . Essential (primary) hypertension 09/19/2015  . Acid reflux 09/19/2015  . HLD (hyperlipidemia) 09/19/2015  . Adiposity 09/19/2015  . Obstructive apnea 09/19/2015  . Cannot sleep 09/19/2015  . Radial nerve disease 09/19/2015  . Cough 11/14/2013    Past Medical History:  Diagnosis Date  . Hx of blood clots   . Hypertension     Social History   Social History  . Marital status: Single    Spouse name: N/A  . Number of children: N/A  . Years of education: N/A   Occupational History  . tech support    Social History Main Topics  . Smoking status: Never Smoker  . Smokeless tobacco: Never Used  . Alcohol use 1.5 oz/week    3 Standard drinks or equivalent per week     Comment: socially  . Drug use: No  . Sexual activity: Not on file   Other Topics Concern  . Not on file   Social History Narrative  . No narrative on file    Outpatient Encounter  Prescriptions as of 09/06/2017  Medication Sig Note  . amLODipine (NORVASC) 5 MG tablet TAKE 1 TABLET BY MOUTH ONCE DAILY   . aspirin 81 MG tablet Take 81 mg by mouth daily.   Marland Kitchen loratadine (CLARITIN) 10 MG tablet Take 1 tablet (10 mg total) by mouth daily.   . montelukast (SINGULAIR) 10 MG tablet TAKE 1 TABLET BY MOUTH AT BEDTIME   . Multiple Vitamin tablet Take by mouth. 09/19/2015: Received from: [facility]  . naproxen (NAPROSYN) 500 MG tablet Take 1 tablet (500 mg total) by mouth 2 (two) times daily with a meal.   . naproxen sodium (ALEVE) 220 MG tablet Take by mouth. 09/19/2015: Received from: [facility]  . pantoprazole (PROTONIX) 40 MG tablet TAKE 1 TABLET BY MOUTH ONCE DAILY 30 MINUTES BEFORE EVENING MEAL   . traZODone (DESYREL) 100 MG tablet TAKE ONE TABLET BY  MOUTH AT BEDTIME.    No facility-administered encounter medications on file as of 09/06/2017.     No Known Allergies  Review of Systems  Constitutional: Negative for fever and malaise/fatigue.  HENT: Negative.   Respiratory: Negative for cough, shortness of breath and wheezing.   Cardiovascular: Negative for chest pain, palpitations, orthopnea, claudication and leg swelling.  Musculoskeletal: Positive for joint pain (mainly in his shoulders, more on the left than the right.  ).  Skin: Negative.   Neurological: Negative.  Negative for weakness.  Endo/Heme/Allergies: Negative.   Psychiatric/Behavioral: Negative.     Objective:  BP 132/84 (BP Location: Right Arm, Patient Position: Sitting, Cuff Size: Normal)   Pulse 70   Temp 97.7 F (36.5 C) (Oral)   Resp 16   Wt 272 lb (123.4 kg)   SpO2 96%   BMI 35.16 kg/m   Physical Exam  Constitutional: He is oriented to person, place, and time and well-developed, well-nourished, and in no distress.  HENT:  Head: Normocephalic and atraumatic.  Right Ear: External ear normal.  Left Ear: External ear normal.  Nose: Nose normal.  Eyes: Conjunctivae are normal. No scleral icterus.  Neck: No thyromegaly present.  Cardiovascular: Normal rate, regular rhythm, normal heart sounds and intact distal pulses.   Pulmonary/Chest: Effort normal and breath sounds normal.  Abdominal: Soft.  Musculoskeletal: He exhibits edema.  1+ edema.  Neurological: He is alert and oriented to person, place, and time. Gait normal. GCS score is 15.  Skin: Skin is warm and dry.  Psychiatric: Mood, memory, affect and judgment normal.    Assessment and Plan :  1. Essential (primary) hypertension   2. Gastroesophageal reflux disease, esophagitis presence not specified   3. Hyperlipidemia, unspecified hyperlipidemia type   4. Other insomnia   5. Need for influenza vaccination  - Flu Vaccine QUAD 6+ mos PF IM (Fluarix Quad PF) 6.Left Plantar Fasciitis

## 2017-09-23 ENCOUNTER — Other Ambulatory Visit: Payer: Self-pay | Admitting: Family Medicine

## 2018-01-04 ENCOUNTER — Other Ambulatory Visit: Payer: Self-pay | Admitting: Family Medicine

## 2018-02-10 ENCOUNTER — Ambulatory Visit (INDEPENDENT_AMBULATORY_CARE_PROVIDER_SITE_OTHER): Payer: BLUE CROSS/BLUE SHIELD | Admitting: Family Medicine

## 2018-02-10 ENCOUNTER — Encounter: Payer: Self-pay | Admitting: Family Medicine

## 2018-02-10 VITALS — BP 130/86 | HR 81 | Temp 98.7°F | Resp 16

## 2018-02-10 DIAGNOSIS — H6691 Otitis media, unspecified, right ear: Secondary | ICD-10-CM | POA: Diagnosis not present

## 2018-02-10 DIAGNOSIS — I10 Essential (primary) hypertension: Secondary | ICD-10-CM

## 2018-02-10 MED ORDER — AMLODIPINE BESYLATE 5 MG PO TABS: 5.0000 mg | ORAL_TABLET | Freq: Every day | ORAL | 11 refills | Status: DC

## 2018-02-10 MED ORDER — AMOXICILLIN 500 MG PO CAPS: 1000.0000 mg | ORAL_CAPSULE | Freq: Two times a day (BID) | ORAL | 0 refills | Status: AC

## 2018-02-10 NOTE — Progress Notes (Signed)
Patient: Darrell Montes. Adult    DOB: 01-25-1961   57 y.o.   MRN: [id number] Visit Date: 02/10/2018  Today's Provider: Lelon Huh, MD   Chief Complaint  Patient presents with  . Ear Pain    x 2 weeks   Subjective:    Otalgia   There is pain in the right ear. This is a new problem. Episode onset: 2 weeks ago. The problem has been gradually worsening. There has been no fever. Pertinent negatives include no abdominal pain, coughing, ear discharge, headaches, hearing loss, neck pain, rhinorrhea, sore throat or vomiting. He has tried nothing for the symptoms.  Usually eases up after an hour or two in the morning, but has persisted today. Has been draining brown material today.       No Known Allergies   Current Outpatient Medications:  .  aspirin 81 MG tablet, Take 81 mg by mouth daily., Disp: , Rfl:  .  loratadine (CLARITIN) 10 MG tablet, Take 1 tablet (10 mg total) by mouth daily., Disp: 90 tablet, Rfl: 3 .  meloxicam (MOBIC) 15 MG tablet, Take 15 mg by mouth daily., Disp: , Rfl:  .  montelukast (SINGULAIR) 10 MG tablet, TAKE 1 TABLET BY MOUTH AT BEDTIME, Disp: 30 tablet, Rfl: 12 .  Multiple Vitamin tablet, Take by mouth., Disp: , Rfl:  .  pantoprazole (PROTONIX) 40 MG tablet, TAKE 1 TABLET BY MOUTH ONCE DAILY 30 MINUTES BEFORE EVENING MEAL, Disp: 90 tablet, Rfl: 3 .  traZODone (DESYREL) 100 MG tablet, TAKE 1 TABLET BY MOUTH AT BEDTIME, Disp: 30 tablet, Rfl: 11 .  amLODipine (NORVASC) 5 MG tablet, TAKE 1 TABLET BY MOUTH ONCE DAILY (Patient not taking: Reported on 02/10/2018), Disp: 30 tablet, Rfl: 11  Review of Systems  Constitutional: Negative for appetite change, chills and fever.  HENT: Positive for ear pain (right ear) and postnasal drip. Negative for ear discharge, hearing loss, rhinorrhea, sinus pressure, sinus pain, sneezing and sore throat.   Respiratory: Negative for cough, chest tightness, shortness of breath and wheezing.   Cardiovascular: Negative for chest  pain and palpitations.  Gastrointestinal: Negative for abdominal pain, nausea and vomiting.  Musculoskeletal: Negative for neck pain.  Neurological: Negative for headaches.    Social History   Tobacco Use  . Smoking status: Never Smoker  . Smokeless tobacco: Never Used  Substance Use Topics  . Alcohol use: Yes    Alcohol/week: 1.5 oz    Types: 3 Standard drinks or equivalent per week    Comment: socially   Objective:   BP 130/86 (BP Location: Left Arm, Patient Position: Sitting, Cuff Size: Large)   Pulse 81   Temp 98.7 F (37.1 C) (Oral)   Resp 16   SpO2 97% Comment: room air There were no vitals filed for this visit.   Physical Exam  General Appearance:    Alert, cooperative, no distress  HENT:   left TM normal without fluid or infection, right TM slightly inflamed, dull,  neck without nodes and sinuses nontender  Eyes:    PERRL, conjunctiva/corneas clear, EOM's intact       Lungs:     Clear to auscultation bilaterally, respirations unlabored  Heart:    Regular rate and rhythm  Neurologic:   Awake, alert, oriented x 3. No apparent focal neurological           defect.           Assessment & Plan:  1. Right otitis media, unspecified otitis media type  - amoxicillin (AMOXIL) 500 MG capsule; Take 2 capsules (1,000 mg total) by mouth 2 (two) times daily for 10 days.  Dispense: 40 capsule; Refill: 0  2. Essential (primary) hypertension Out of - amLODipine (NORVASC) 5 MG tablet; Take 1 tablet (5 mg total) by mouth daily.  Dispense: 30 tablet; Refill: 11 x 2 weeks, sent refill today.   Call if symptoms change or if not rapidly improving.          Lelon Huh, MD  [facility]

## 2018-03-08 ENCOUNTER — Encounter: Payer: Self-pay | Admitting: Family Medicine

## 2018-03-08 ENCOUNTER — Ambulatory Visit (INDEPENDENT_AMBULATORY_CARE_PROVIDER_SITE_OTHER): Payer: BLUE CROSS/BLUE SHIELD | Admitting: Family Medicine

## 2018-03-08 ENCOUNTER — Other Ambulatory Visit: Payer: Self-pay

## 2018-03-08 VITALS — BP 138/82 | HR 76 | Temp 98.2°F | Resp 16 | Wt 273.0 lb

## 2018-03-08 DIAGNOSIS — Z0000 Encounter for general adult medical examination without abnormal findings: Secondary | ICD-10-CM

## 2018-03-08 DIAGNOSIS — Z1211 Encounter for screening for malignant neoplasm of colon: Secondary | ICD-10-CM

## 2018-03-08 DIAGNOSIS — Z125 Encounter for screening for malignant neoplasm of prostate: Secondary | ICD-10-CM | POA: Diagnosis not present

## 2018-03-08 LAB — POCT URINALYSIS DIPSTICK
Bilirubin, UA: NEGATIVE
Glucose, UA: NEGATIVE
KETONES UA: NEGATIVE
LEUKOCYTES UA: NEGATIVE
Nitrite, UA: NEGATIVE
PH UA: 6.5 (ref 5.0–8.0)
Protein, UA: NEGATIVE
RBC UA: NEGATIVE
SPEC GRAV UA: 1.01 (ref 1.010–1.025)
UROBILINOGEN UA: 0.2 U/dL

## 2018-03-08 LAB — IFOBT (OCCULT BLOOD): IFOBT: NEGATIVE

## 2018-03-08 NOTE — Progress Notes (Signed)
Patient: Darrell Schill., Male    DOB: [birth year], 57 y.o.   MRN: [id number] Visit Date: 03/08/2018  Today's Provider: Wilhemena Durie, MD   Chief Complaint  Patient presents with  . Annual Exam   Subjective:  Darrell Keelyn Fjelstad. is a 57 y.o. male who presents today for health maintenance and complete physical. He feels well. He reports exercising 3-4 times per week. He reports he is sleeping well.  Immunization History  Administered Date(s) Administered  . Influenza Split 11/13/2013  . Influenza,inj,Quad PF,6+ Mos 10/01/2015, 08/24/2016, 09/06/2017  . Tdap 04/28/2011   01/10/12 Colonsocopy-Diverticulosis, repeat 10 years.  Review of Systems  Constitutional: Negative.   HENT: Negative.   Eyes: Negative.   Respiratory: Negative.   Cardiovascular: Positive for leg swelling.  Gastrointestinal: Negative.   Endocrine: Negative.   Genitourinary: Positive for frequency.  Musculoskeletal: Positive for back pain (lower).  Skin: Negative.   Allergic/Immunologic: Negative.   Neurological: Positive for numbness (fingers and feet).  Hematological: Negative.   Psychiatric/Behavioral: Negative.     Social History   Socioeconomic History  . Marital status: Single    Spouse name: Not on file  . Number of children: 2  . Years of education: Not on file  . Highest education level: Not on file  Occupational History  . Occupation: tech support  Social Needs  . Financial resource strain: Not very hard  . Food insecurity:    Worry: Never true    Inability: Never true  . Transportation needs:    Medical: Not on file    Non-medical: Not on file  Tobacco Use  . Smoking status: Never Smoker  . Smokeless tobacco: Never Used  Substance and Sexual Activity  . Alcohol use: Yes    Alcohol/week: 1.5 oz    Types: 3 Standard drinks or equivalent per week    Comment: socially  . Drug use: No  . Sexual activity: Not on file  Lifestyle  . Physical activity:    Days per week: Not on file    Minutes per session: Not on file  . Stress: Not on file  Relationships  . Social connections:    Talks on phone: Not on file    Gets together: Not on file    Attends religious service: Not on file    Active member of club or organization: Not on file    Attends meetings of clubs or organizations: Not on file    Relationship status: Not on file  . Intimate partner violence:    Fear of current or ex partner: Not on file    Emotionally abused: Not on file    Physically abused: Not on file    Forced sexual activity: Not on file  Other Topics Concern  . Not on file  Social History Narrative  . Not on file    Patient Active Problem List   Diagnosis Date Noted  . Allergic rhinitis 09/19/2015  . Acute thromboembolism of deep veins of lower extremity ([place]) 09/19/2015  . Essential (primary) hypertension 09/19/2015  . Acid reflux 09/19/2015  . HLD (hyperlipidemia) 09/19/2015  . Adiposity 09/19/2015  . Obstructive apnea 09/19/2015  . Cannot sleep 09/19/2015  . Radial nerve disease 09/19/2015  . Cough 11/14/2013    Past Surgical History:  Procedure Laterality Date  . BACK SURGERY     X2 lower disc   . KNEE SURGERY Left    X2 scope  . UPPER GI ENDOSCOPY  07/11/2013   normal duodenum,  benign appearing esophageal stricture x2     His family history includes Bladder Cancer in his mother; COPD in his father and mother; Hypertension in his father; Sleep apnea in his father.     Outpatient Encounter Medications as of 03/08/2018  Medication Sig Note  . amLODipine (NORVASC) 5 MG tablet Take 1 tablet (5 mg total) by mouth daily.   Marland Kitchen aspirin 81 MG tablet Take 81 mg by mouth daily.   Marland Kitchen loratadine (CLARITIN) 10 MG tablet Take 1 tablet (10 mg total) by mouth daily.   . montelukast (SINGULAIR) 10 MG tablet TAKE 1 TABLET BY MOUTH AT BEDTIME   . Multiple Vitamin tablet Take by mouth. 09/19/2015: Received from: [facility]  . pantoprazole (PROTONIX) 40 MG tablet TAKE 1 TABLET  BY MOUTH ONCE DAILY 30 MINUTES BEFORE EVENING MEAL   . traZODone (DESYREL) 100 MG tablet TAKE 1 TABLET BY MOUTH AT BEDTIME   . [DISCONTINUED] meloxicam (MOBIC) 15 MG tablet Take 15 mg by mouth daily.    No facility-administered encounter medications on file as of 03/08/2018.     Patient Care Team: Jerrol Banana., MD as PCP - General (Family Medicine)      Objective:   Vitals: There were no vitals filed for this visit.  Physical Exam  Constitutional: He is oriented to person, place, and time. He appears well-developed and well-nourished.  HENT:  Head: Normocephalic and atraumatic.  Right Ear: External ear normal.  Left Ear: External ear normal.  Nose: Nose normal.  Mouth/Throat: Oropharynx is clear and moist.  Eyes: Pupils are equal, round, and reactive to light. Conjunctivae and EOM are normal.  Neck: Normal range of motion. Neck supple.  Cardiovascular: Normal rate, regular rhythm, normal heart sounds and intact distal pulses.  Pulmonary/Chest: Effort normal and breath sounds normal.  Abdominal: Soft. Bowel sounds are normal.  Genitourinary: Rectum normal, prostate normal and penis normal.  Musculoskeletal: Normal range of motion. He exhibits edema.  1+ edema.  Neurological: He is alert and oriented to person, place, and time.  Skin: Skin is warm and dry.  Psychiatric: He has a normal mood and affect. His behavior is normal. Judgment and thought content normal.     Depression Screen PHQ 2/9 Scores 03/08/2018 03/07/2017 02/25/2016 10/01/2015  PHQ - 2 Score 0 0 0 0  PHQ- 9 Score - 0 - -      Assessment & Plan:     Routine Health Maintenance and Physical Exam  Exercise Activities and Dietary recommendations Goals    None      Immunization History  Administered Date(s) Administered  . Influenza Split 11/13/2013  . Influenza,inj,Quad PF,6+ Mos 10/01/2015, 08/24/2016, 09/06/2017  . Tdap 04/28/2011    Health Maintenance  Topic Date Due  . HIV Screening   07/20/1976  . INFLUENZA VACCINE  06/29/2018  . TETANUS/TDAP  04/27/2021  . COLONOSCOPY  01/09/2022  . Hepatitis C Screening  Completed     Discussed health benefits of physical activity, and encouraged him to engage in regular exercise appropriate for his age and condition.  Mild Neuropathic symptoms Both feet and fingers.  I have done the exam and reviewed the chart and it is accurate to the best of my knowledge. Development worker, community has been used and  any errors in dictation or transcription are unintentional. Miguel Aschoff M.D. [facility]

## 2018-03-09 LAB — COMPREHENSIVE METABOLIC PANEL
ALBUMIN: 4.2 g/dL (ref 3.5–5.5)
ALK PHOS: 96 IU/L (ref 39–117)
ALT: 45 IU/L — ABNORMAL HIGH (ref 0–44)
AST: 43 IU/L — AB (ref 0–40)
Albumin/Globulin Ratio: 1.8 (ref 1.2–2.2)
BILIRUBIN TOTAL: 0.4 mg/dL (ref 0.0–1.2)
BUN / CREAT RATIO: 15 (ref 9–20)
BUN: 11 mg/dL (ref 6–24)
CHLORIDE: 100 mmol/L (ref 96–106)
CO2: 24 mmol/L (ref 20–29)
Calcium: 9 mg/dL (ref 8.7–10.2)
Creatinine, Ser: 0.75 mg/dL — ABNORMAL LOW (ref 0.76–1.27)
GFR calc Af Amer: 119 mL/min/{1.73_m2} (ref >59)
GFR calc non Af Amer: 103 mL/min/{1.73_m2} (ref >59)
GLOBULIN, TOTAL: 2.4 g/dL (ref 1.5–4.5)
GLUCOSE: 114 mg/dL — AB (ref 65–99)
Potassium: 4.3 mmol/L (ref 3.5–5.2)
SODIUM: 141 mmol/L (ref 134–144)
Total Protein: 6.6 g/dL (ref 6.0–8.5)

## 2018-03-09 LAB — PSA: PROSTATE SPECIFIC AG, SERUM: 1.7 ng/mL (ref 0.0–4.0)

## 2018-03-09 LAB — CBC WITH DIFFERENTIAL/PLATELET
BASOS ABS: 0 10*3/uL (ref 0.0–0.2)
Basos: 1 %
EOS (ABSOLUTE): 0.2 10*3/uL (ref 0.0–0.4)
Eos: 4 %
HEMOGLOBIN: 14.1 g/dL (ref 13.0–17.7)
Hematocrit: 42.1 % (ref 37.5–51.0)
Immature Grans (Abs): 0 10*3/uL (ref 0.0–0.1)
Immature Granulocytes: 0 %
LYMPHS ABS: 0.8 10*3/uL (ref 0.7–3.1)
Lymphs: 15 %
MCH: 30.4 pg (ref 26.6–33.0)
MCHC: 33.5 g/dL (ref 31.5–35.7)
MCV: 91 fL (ref 79–97)
MONOCYTES: 7 %
MONOS ABS: 0.4 10*3/uL (ref 0.1–0.9)
NEUTROS ABS: 3.7 10*3/uL (ref 1.4–7.0)
Neutrophils: 73 %
Platelets: 151 10*3/uL (ref 150–379)
RBC: 4.64 x10E6/uL (ref 4.14–5.80)
RDW: 14.2 % (ref 12.3–15.4)
WBC: 5.1 10*3/uL (ref 3.4–10.8)

## 2018-03-09 LAB — TSH: TSH: 2.52 u[IU]/mL (ref 0.450–4.500)

## 2018-03-09 LAB — LIPID PANEL WITH LDL/HDL RATIO
CHOLESTEROL TOTAL: 205 mg/dL — AB (ref 100–199)
HDL: 41 mg/dL (ref >39)
LDL Calculated: 130 mg/dL — ABNORMAL HIGH (ref 0–99)
LDl/HDL Ratio: 3.2 ratio (ref 0.0–3.6)
TRIGLYCERIDES: 172 mg/dL — AB (ref 0–149)
VLDL Cholesterol Cal: 34 mg/dL (ref 5–40)

## 2018-06-28 DIAGNOSIS — M1712 Unilateral primary osteoarthritis, left knee: Secondary | ICD-10-CM | POA: Diagnosis not present

## 2018-06-28 DIAGNOSIS — M25562 Pain in left knee: Secondary | ICD-10-CM | POA: Diagnosis not present

## 2018-06-30 ENCOUNTER — Other Ambulatory Visit: Payer: Self-pay | Admitting: Family Medicine

## 2018-06-30 DIAGNOSIS — R053 Chronic cough: Secondary | ICD-10-CM

## 2018-06-30 DIAGNOSIS — R05 Cough: Secondary | ICD-10-CM

## 2018-07-07 ENCOUNTER — Other Ambulatory Visit: Payer: Self-pay | Admitting: Family Medicine

## 2018-08-31 DIAGNOSIS — M25562 Pain in left knee: Secondary | ICD-10-CM | POA: Diagnosis not present

## 2018-08-31 DIAGNOSIS — M1712 Unilateral primary osteoarthritis, left knee: Secondary | ICD-10-CM | POA: Diagnosis not present

## 2018-09-13 ENCOUNTER — Ambulatory Visit (INDEPENDENT_AMBULATORY_CARE_PROVIDER_SITE_OTHER): Payer: BLUE CROSS/BLUE SHIELD | Admitting: Family Medicine

## 2018-09-13 VITALS — BP 142/84 | HR 82 | Temp 98.0°F | Resp 16 | Wt 285.0 lb

## 2018-09-13 DIAGNOSIS — Z6836 Body mass index (BMI) 36.0-36.9, adult: Secondary | ICD-10-CM | POA: Diagnosis not present

## 2018-09-13 DIAGNOSIS — I10 Essential (primary) hypertension: Secondary | ICD-10-CM | POA: Diagnosis not present

## 2018-09-13 DIAGNOSIS — E785 Hyperlipidemia, unspecified: Secondary | ICD-10-CM

## 2018-09-13 DIAGNOSIS — Z23 Encounter for immunization: Secondary | ICD-10-CM

## 2018-09-13 NOTE — Progress Notes (Signed)
Darrell Montes.  MRN: [id number] DOB: 10-01-1961  Subjective:  HPI   The patient is a 57 year old male who presents for follow up of chronic health.  He was last seen on 03/08/18 for his annual exam.   Pt says he did not take his normal morning meds. Hypertension BP Readings from Last 3 Encounters:  09/13/18 (!) 142/84  03/08/18 138/82  02/10/18 130/86   Patient reports he has not yet taken his medication today.  Patient Active Problem List   Diagnosis Date Noted  . Allergic rhinitis 09/19/2015  . Acute thromboembolism of deep veins of lower extremity ([place]) 09/19/2015  . Essential (primary) hypertension 09/19/2015  . Acid reflux 09/19/2015  . HLD (hyperlipidemia) 09/19/2015  . Adiposity 09/19/2015  . Obstructive apnea 09/19/2015  . Cannot sleep 09/19/2015  . Radial nerve disease 09/19/2015  . Cough 11/14/2013    Past Medical History:  Diagnosis Date  . Hx of blood clots   . Hypertension     Social History   Socioeconomic History  . Marital status: Single    Spouse name: Not on file  . Number of children: 2  . Years of education: Not on file  . Highest education level: Not on file  Occupational History  . Occupation: tech support  Social Needs  . Financial resource strain: Not very hard  . Food insecurity:    Worry: Never true    Inability: Never true  . Transportation needs:    Medical: Not on file    Non-medical: Not on file  Tobacco Use  . Smoking status: Never Smoker  . Smokeless tobacco: Never Used  Substance and Sexual Activity  . Alcohol use: Yes    Alcohol/week: 3.0 standard drinks    Types: 3 Standard drinks or equivalent per week    Comment: socially  . Drug use: No  . Sexual activity: Not on file  Lifestyle  . Physical activity:    Days per week: Not on file    Minutes per session: Not on file  . Stress: Not on file  Relationships  . Social connections:    Talks on phone: Not on file    Gets together: Not on file    Attends  religious service: Not on file    Active member of club or organization: Not on file    Attends meetings of clubs or organizations: Not on file    Relationship status: Not on file  . Intimate partner violence:    Fear of current or ex partner: Not on file    Emotionally abused: Not on file    Physically abused: Not on file    Forced sexual activity: Not on file  Other Topics Concern  . Not on file  Social History Narrative  . Not on file    Outpatient Encounter Medications as of 09/13/2018  Medication Sig Note  . amLODipine (NORVASC) 5 MG tablet Take 1 tablet (5 mg total) by mouth daily.   Marland Kitchen aspirin 81 MG tablet Take 81 mg by mouth daily.   Marland Kitchen loratadine (CLARITIN) 10 MG tablet Take 1 tablet (10 mg total) by mouth daily.   . montelukast (SINGULAIR) 10 MG tablet TAKE 1 TABLET BY MOUTH AT BEDTIME   . Multiple Vitamin tablet Take by mouth. 09/19/2015: Received from: [facility]  . pantoprazole (PROTONIX) 40 MG tablet TAKE 1 TABLET BY MOUTH ONCE DAILY 30 MINUTES BEFORE EVENING MEAL   . traZODone (DESYREL) 100 MG tablet TAKE  1 TABLET BY MOUTH AT BEDTIME    No facility-administered encounter medications on file as of 09/13/2018.     No Known Allergies  Review of Systems  Constitutional: Negative for fever and malaise/fatigue.  Eyes: Negative.   Respiratory: Negative for cough, shortness of breath and wheezing.   Cardiovascular: Negative for chest pain, palpitations, orthopnea, claudication and leg swelling.  Gastrointestinal: Negative.   Neurological: Negative for dizziness and headaches.  Endo/Heme/Allergies: Negative.   Psychiatric/Behavioral: Negative.     Objective:  BP (!) 142/84 (BP Location: Right Arm, Patient Position: Sitting, Cuff Size: Large)   Pulse 82   Temp 98 F (36.7 C) (Oral)   Resp 16   Wt 285 lb (129.3 kg)   BMI 36.84 kg/m   Physical Exam  Constitutional: He is oriented to person, place, and time and well-developed, well-nourished, and  in no distress.  HENT:  Head: Normocephalic and atraumatic.  Eyes: Conjunctivae are normal.  Neck: No thyromegaly present.  Cardiovascular: Normal rate, regular rhythm and normal heart sounds.  Pulmonary/Chest: Effort normal and breath sounds normal.  Abdominal: Soft.  Musculoskeletal: He exhibits no edema.  Neurological: He is alert and oriented to person, place, and time. Gait normal. GCS score is 15.  Skin: Skin is warm and dry.  Psychiatric: Mood, memory, affect and judgment normal.    Assessment and Plan :   1. Need for influenza vaccination  - Flu Vaccine QUAD 6+ mos PF IM (Fluarix Quad PF)  2. Essential (primary) hypertension   3. Hyperlipidemia, unspecified hyperlipidemia type   4. Class 2 severe obesity due to excess calories with serious comorbidity and body mass index (BMI) of 36.0 to 36.9 in adult [facility]) Comorbid HTN,/GERD.  I have done the exam and reviewed the chart and it is accurate to the best of my knowledge. Development worker, community has been used and  any errors in dictation or transcription are unintentional. Miguel Aschoff M.D. [facility]

## 2018-09-15 DIAGNOSIS — M1712 Unilateral primary osteoarthritis, left knee: Secondary | ICD-10-CM | POA: Diagnosis not present

## 2018-11-18 ENCOUNTER — Other Ambulatory Visit: Payer: Self-pay | Admitting: Family Medicine

## 2018-11-18 DIAGNOSIS — R05 Cough: Secondary | ICD-10-CM

## 2018-11-18 DIAGNOSIS — R053 Chronic cough: Secondary | ICD-10-CM

## 2019-02-22 ENCOUNTER — Telehealth: Payer: Self-pay | Admitting: Family Medicine

## 2019-02-22 NOTE — Telephone Encounter (Signed)
Ok to refer to urology

## 2019-02-22 NOTE — Telephone Encounter (Signed)
Left message to call back  

## 2019-02-22 NOTE — Telephone Encounter (Signed)
Advised as below. Appt scheduled for Monday (02/26/2019).

## 2019-02-22 NOTE — Telephone Encounter (Signed)
I would need to see him first.  I can see him on Monday.  If urology referral is appropriate then we will be happy to do so.

## 2019-02-22 NOTE — Telephone Encounter (Signed)
Pt would like a nurse to call him back.  He has had some frequent urination.  He wants to know if needs a referral to a urologist.  He mentioned Dr. Bernardo Heater  CB#  [phone]  Thanks Con Memos

## 2019-02-26 ENCOUNTER — Other Ambulatory Visit: Payer: Self-pay

## 2019-02-26 ENCOUNTER — Ambulatory Visit (INDEPENDENT_AMBULATORY_CARE_PROVIDER_SITE_OTHER): Payer: BLUE CROSS/BLUE SHIELD | Admitting: Family Medicine

## 2019-02-26 ENCOUNTER — Encounter: Payer: Self-pay | Admitting: Family Medicine

## 2019-02-26 ENCOUNTER — Other Ambulatory Visit: Payer: Self-pay | Admitting: Family Medicine

## 2019-02-26 VITALS — BP 136/82 | HR 69 | Ht 74.0 in | Wt 287.6 lb

## 2019-02-26 DIAGNOSIS — R5383 Other fatigue: Secondary | ICD-10-CM

## 2019-02-26 DIAGNOSIS — G4733 Obstructive sleep apnea (adult) (pediatric): Secondary | ICD-10-CM | POA: Diagnosis not present

## 2019-02-26 DIAGNOSIS — R350 Frequency of micturition: Secondary | ICD-10-CM | POA: Diagnosis not present

## 2019-02-26 DIAGNOSIS — Z1211 Encounter for screening for malignant neoplasm of colon: Secondary | ICD-10-CM

## 2019-02-26 DIAGNOSIS — N400 Enlarged prostate without lower urinary tract symptoms: Secondary | ICD-10-CM

## 2019-02-26 LAB — POCT URINALYSIS DIPSTICK
Blood, UA: NEGATIVE
Glucose, UA: NEGATIVE
KETONES UA: NEGATIVE
Leukocytes, UA: NEGATIVE
Nitrite, UA: NEGATIVE
PROTEIN UA: NEGATIVE
SPEC GRAV UA: 1.02 (ref 1.010–1.025)
UROBILINOGEN UA: 0.2 U/dL
pH, UA: 6.5 (ref 5.0–8.0)

## 2019-02-26 LAB — HEMOCCULT GUIAC POC 1CARD (OFFICE): Fecal Occult Blood, POC: NEGATIVE

## 2019-02-26 LAB — POCT CBG (FASTING - GLUCOSE)-MANUAL ENTRY: Glucose Fasting, POC: 145 mg/dL — AB (ref 70–99)

## 2019-02-26 MED ORDER — TAMSULOSIN HCL 0.4 MG PO CAPS: 0.4000 mg | ORAL_CAPSULE | Freq: Every day | ORAL | 3 refills | Status: DC

## 2019-02-26 NOTE — Progress Notes (Signed)
Patient: Darrell Montes. Male    DOB: Nov 09, 1961   58 y.o.   MRN: [id number] Visit Date: 02/26/2019  Today's Provider: Wilhemena Durie, MD   Chief Complaint  Patient presents with  . urine frequency at night    possible referral to urology needed  . Fatigue    due to not being able to sleep due to having urine frequency at night   Subjective:     HPI  Pt reports he has been having a lot of urine frequency during the night and also staying fatigued because of being wakened during the night to urinate.  Pt is requesting a referral to urology. He has nocturia x4-5.  He states he is going a normal amount during the day. He has a couple of other issues.  He has known sleep apnea but states he does not wear the CPAP. He also complains of increasing fatigue recently.  No other symptoms.  Does complain of decreased libido.  No Known Allergies   Current Outpatient Medications:  .  amLODipine (NORVASC) 5 MG tablet, Take 1 tablet (5 mg total) by mouth daily., Disp: 30 tablet, Rfl: 11 .  aspirin 81 MG tablet, Take 81 mg by mouth daily., Disp: , Rfl:  .  loratadine (CLARITIN) 10 MG tablet, Take 1 tablet (10 mg total) by mouth daily., Disp: 90 tablet, Rfl: 3 .  montelukast (SINGULAIR) 10 MG tablet, TAKE 1 TABLET BY MOUTH AT BEDTIME, Disp: 30 tablet, Rfl: 11 .  Multiple Vitamin tablet, Take by mouth., Disp: , Rfl:  .  pantoprazole (PROTONIX) 40 MG tablet, TAKE 1 TABLET BY MOUTH ONCE DAILY 30 MINUTES BEFORE EVENING MEAL, Disp: 90 tablet, Rfl: 3 .  traZODone (DESYREL) 100 MG tablet, TAKE 1 TABLET BY MOUTH AT BEDTIME, Disp: 30 tablet, Rfl: 11 .  tamsulosin (FLOMAX) 0.4 MG CAPS capsule, Take 1 capsule (0.4 mg total) by mouth daily., Disp: 30 capsule, Rfl: 3  Review of Systems  Constitutional: Positive for fatigue.  HENT: Negative.   Eyes: Negative.   Respiratory: Negative.   Cardiovascular: Negative.   Gastrointestinal: Negative.   Endocrine: Negative.   Genitourinary: Positive  for difficulty urinating and frequency (urine at night time during sleep hours).       Decreased libido.  Nocturia x4-5.  Musculoskeletal: Negative.   Skin: Negative.   Allergic/Immunologic: Negative.   Neurological: Negative.   Hematological: Negative.   Psychiatric/Behavioral: Negative.     Social History   Tobacco Use  . Smoking status: Never Smoker  . Smokeless tobacco: Never Used  Substance Use Topics  . Alcohol use: Yes    Alcohol/week: 3.0 standard drinks    Types: 3 Standard drinks or equivalent per week    Comment: socially      Objective:   BP 136/82 (BP Location: Right Arm, Patient Position: Sitting, Cuff Size: Large)   Pulse 69   Ht 6\' 2"  (1.88 m)   Wt 287 lb 9.6 oz (130.5 kg)   SpO2 97%   BMI 36.93 kg/m  Vitals:   02/26/19 0937  BP: 136/82  Pulse: 69  SpO2: 97%  Weight: 287 lb 9.6 oz (130.5 kg)  Height: 6\' 2"  (1.88 m)     Physical Exam Vitals signs reviewed.  Constitutional:      Appearance: He is well-developed.  HENT:     Head: Normocephalic and atraumatic.     Right Ear: External ear normal.     Left Ear: External ear  normal.     Nose: Nose normal.  Eyes:     Conjunctiva/sclera: Conjunctivae normal.     Pupils: Pupils are equal, round, and reactive to light.  Neck:     Musculoskeletal: Normal range of motion and neck supple.  Cardiovascular:     Rate and Rhythm: Normal rate and regular rhythm.     Heart sounds: Normal heart sounds.  Pulmonary:     Effort: Pulmonary effort is normal.     Breath sounds: Normal breath sounds.  Abdominal:     General: Bowel sounds are normal.     Palpations: Abdomen is soft.  Genitourinary:    Penis: Normal.      Prostate: Normal.     Rectum: Normal.  Musculoskeletal: Normal range of motion.     Comments: 1+ edema.  Skin:    General: Skin is warm and dry.  Neurological:     General: No focal deficit present.     Mental Status: He is alert and oriented to person, place, and time. Mental status is at  baseline.  Psychiatric:        Mood and Affect: Mood normal.        Behavior: Behavior normal.        Thought Content: Thought content normal.        Judgment: Judgment normal.         Assessment & Plan    1. Frequency of urination No sign of infection. - POCT Urinalysis Dipstick - POCT CBG (Fasting - Glucose) random blood sugar is 145  2. Fatigue, unspecified type Obtain lab work as below.  Suspect the main issue is untreated sleep apnea.  We will try to get him to get back on the CPAP.  He has a machine at home. - POCT CBG (Fasting - Glucose) - CBC w/Diff/Platelet - Comp. Metabolic Panel (12) - TSH - Testosterone  3. Obstructive sleep apnea syndrome Start back on CPAP.  I will see him back in 1 to 2 months. - CBC w/Diff/Platelet - Comp. Metabolic Panel (12) - TSH - Testosterone  4. Benign prostatic hyperplasia, unspecified whether lower urinary tract symptoms present Start tamsulosin daily.  May need referral to urology but may in fact be due to BPH and then sleep apnea contributing to this. - CBC w/Diff/Platelet - Comp. Metabolic Panel (12) - TSH - Testosterone  5. Colon cancer screening OC light is negative today. - POCT occult blood stool    I have done the exam and reviewed the above chart and it is accurate to the best of my knowledge. Development worker, community has been used in this note in any air is in the dictation or transcription are unintentional.  Wilhemena Durie, MD  [place]  HPI and API scribed in the presence of Dr Wilhemena Durie.  Edd Arbour, [place]

## 2019-02-27 LAB — CBC WITH DIFFERENTIAL/PLATELET
Basophils Absolute: 0 10*3/uL (ref 0.0–0.2)
Basos: 1 %
EOS (ABSOLUTE): 0.2 10*3/uL (ref 0.0–0.4)
Eos: 3 %
Hematocrit: 41.3 % (ref 37.5–51.0)
Hemoglobin: 14.1 g/dL (ref 13.0–17.7)
IMMATURE GRANS (ABS): 0 10*3/uL (ref 0.0–0.1)
Immature Granulocytes: 1 %
LYMPHS: 16 %
Lymphocytes Absolute: 1 10*3/uL (ref 0.7–3.1)
MCH: 31.2 pg (ref 26.6–33.0)
MCHC: 34.1 g/dL (ref 31.5–35.7)
MCV: 91 fL (ref 79–97)
Monocytes Absolute: 0.5 10*3/uL (ref 0.1–0.9)
Monocytes: 8 %
NEUTROS ABS: 4.6 10*3/uL (ref 1.4–7.0)
NEUTROS PCT: 71 %
Platelets: 142 10*3/uL — ABNORMAL LOW (ref 150–450)
RBC: 4.52 x10E6/uL (ref 4.14–5.80)
RDW: 13.1 % (ref 11.6–15.4)
WBC: 6.3 10*3/uL (ref 3.4–10.8)

## 2019-02-27 LAB — COMP. METABOLIC PANEL (12)
ALBUMIN: 4.3 g/dL (ref 3.8–4.9)
ALK PHOS: 91 IU/L (ref 39–117)
AST: 56 IU/L — AB (ref 0–40)
Albumin/Globulin Ratio: 2 (ref 1.2–2.2)
BUN/Creatinine Ratio: 19 (ref 9–20)
BUN: 15 mg/dL (ref 6–24)
Bilirubin Total: 0.4 mg/dL (ref 0.0–1.2)
CHLORIDE: 101 mmol/L (ref 96–106)
Calcium: 9.4 mg/dL (ref 8.7–10.2)
Creatinine, Ser: 0.81 mg/dL (ref 0.76–1.27)
GFR calc Af Amer: 114 mL/min/{1.73_m2} (ref >59)
GFR, EST NON AFRICAN AMERICAN: 99 mL/min/{1.73_m2} (ref >59)
GLUCOSE: 113 mg/dL — AB (ref 65–99)
Globulin, Total: 2.1 g/dL (ref 1.5–4.5)
POTASSIUM: 4.5 mmol/L (ref 3.5–5.2)
Sodium: 141 mmol/L (ref 134–144)
Total Protein: 6.4 g/dL (ref 6.0–8.5)

## 2019-02-27 LAB — TSH: TSH: 3.17 u[IU]/mL (ref 0.450–4.500)

## 2019-02-27 LAB — TESTOSTERONE: TESTOSTERONE: 194 ng/dL — AB (ref 264–916)

## 2019-02-28 ENCOUNTER — Telehealth: Payer: Self-pay

## 2019-02-28 NOTE — Telephone Encounter (Signed)
Left message for patient call regarding lab results.

## 2019-02-28 NOTE — Telephone Encounter (Signed)
-----   Message from Jerrol Banana., MD sent at 02/28/2019 11:01 AM EDT ----- Testosterone is low--will discuss options on next OV--treatment of low Testosterone is optional--he will need a prolactin drawn on next OV.

## 2019-02-28 NOTE — Telephone Encounter (Signed)
Please review

## 2019-02-28 NOTE — Telephone Encounter (Signed)
Patient was notified of results. Patient wants to know what the next step is since his next ov is not till August. Please advise?

## 2019-03-02 ENCOUNTER — Other Ambulatory Visit: Payer: Self-pay | Admitting: Family Medicine

## 2019-03-02 ENCOUNTER — Telehealth: Payer: Self-pay

## 2019-03-02 DIAGNOSIS — R7989 Other specified abnormal findings of blood chemistry: Secondary | ICD-10-CM

## 2019-03-02 NOTE — Telephone Encounter (Signed)
Spoke to pt and advised that he can make an evisit to discuss treatment of testosterone per Rosanna Randy.  Pt will call back and schedule an appt.  I will order labs per gilbert.  dbs

## 2019-03-02 NOTE — Telephone Encounter (Signed)
-----   Message from Jerrol Banana., MD sent at 03/01/2019  3:08 PM EDT ----- If he want stestosterone treated I can--can do web visit to discuss pros/cons,--willneed prolactin done thru lab also.

## 2019-03-06 ENCOUNTER — Other Ambulatory Visit: Payer: Self-pay

## 2019-03-06 ENCOUNTER — Ambulatory Visit (INDEPENDENT_AMBULATORY_CARE_PROVIDER_SITE_OTHER): Payer: BLUE CROSS/BLUE SHIELD | Admitting: Family Medicine

## 2019-03-06 ENCOUNTER — Encounter: Payer: Self-pay | Admitting: Family Medicine

## 2019-03-06 VITALS — Wt 283.0 lb

## 2019-03-06 DIAGNOSIS — R351 Nocturia: Secondary | ICD-10-CM

## 2019-03-06 DIAGNOSIS — N401 Enlarged prostate with lower urinary tract symptoms: Secondary | ICD-10-CM

## 2019-03-06 DIAGNOSIS — E291 Testicular hypofunction: Secondary | ICD-10-CM

## 2019-03-06 NOTE — Progress Notes (Signed)
Patient: Darrell Montes. Male    DOB: 1961/07/02   58 y.o.   MRN: [id number] Visit Date: 03/06/2019  Today's Provider: Wilhemena Durie, MD   Chief Complaint  Patient presents with  . Nocturia  . Abnormal Lab    decreased testoseterone level   Subjective:     Urinary Frequency   This is a recurrent (patient reports only happens at nighttime) problem. The current episode started more than 1 year ago. The problem has been unchanged. The patient is experiencing no pain. There has been no fever. There is no history of pyelonephritis. Associated symptoms include frequency. Pertinent negatives include no chills, discharge, flank pain, hematuria, hesitancy, nausea, possible pregnancy, sweats, urgency or vomiting. He has tried nothing for the symptoms. There is no history of kidney stones or recurrent UTIs.  Patient has not noticed a difference in the short time he has been on Flomax.  He has not curtailed his drinking of fluids at nighttime.  He has no other symptoms and does not feel poorly.  No dysuria, no hematuria, no significant decreased flow.  Follow up for Abnormal Lab  The patient was last seen for this 1 weeks ago. Changes made at last visit include none,  labs drawn 02/26/19 show testosterone level low at 194, treatment option to discuss .  We discussed that this is a quality-of-life issue.  He wishes to have testosterone treated.  Must obtain prolactin before we can start this.  See him back after 3 months of treatment.   No Known Allergies   Current Outpatient Medications:  .  amLODipine (NORVASC) 5 MG tablet, Take 1 tablet (5 mg total) by mouth daily., Disp: 30 tablet, Rfl: 11 .  aspirin 81 MG tablet, Take 81 mg by mouth daily., Disp: , Rfl:  .  loratadine (CLARITIN) 10 MG tablet, Take 1 tablet (10 mg total) by mouth daily., Disp: 90 tablet, Rfl: 3 .  meloxicam (MOBIC) 15 MG tablet, , Disp: , Rfl:  .  montelukast (SINGULAIR) 10 MG tablet, TAKE 1 TABLET BY MOUTH AT  BEDTIME, Disp: 30 tablet, Rfl: 11 .  Multiple Vitamin tablet, Take by mouth., Disp: , Rfl:  .  pantoprazole (PROTONIX) 40 MG tablet, TAKE 1 TABLET BY MOUTH ONCE DAILY 30 MINUTES BEFORE EVENING MEAL, Disp: 90 tablet, Rfl: 3 .  tamsulosin (FLOMAX) 0.4 MG CAPS capsule, Take 1 capsule (0.4 mg total) by mouth daily., Disp: 30 capsule, Rfl: 3 .  traZODone (DESYREL) 100 MG tablet, TAKE 1 TABLET BY MOUTH AT BEDTIME, Disp: 30 tablet, Rfl: 11  Review of Systems  Constitutional: Negative for chills.  Gastrointestinal: Negative for nausea and vomiting.  Genitourinary: Positive for frequency. Negative for flank pain, hematuria, hesitancy and urgency.    Social History   Tobacco Use  . Smoking status: Never Smoker  . Smokeless tobacco: Never Used  Substance Use Topics  . Alcohol use: Yes    Alcohol/week: 3.0 standard drinks    Types: 3 Standard drinks or equivalent per week    Comment: socially      Objective:   Wt 283 lb (128.4 kg) Comment: pt reports  BMI 36.34 kg/m  Vitals:   03/06/19 0901  Weight: 283 lb (128.4 kg)     Physical Exam      Assessment & Plan    1. Benign prostatic hyperplasia with nocturia Discussed limiting fluids after dinner at night which patient admits that he drinks a lot of fluid at night.  Also discussed adding a medication to shrink the prostate which will take several months.  Possible urology referral in the future.  2. Hypogonadism in male Patient wishes to have this treated.  Check a prolactin and consider either Clomid or topical testosterone.  Clomid half of a tablet daily may prove to be a lot cheaper. The patient was unable to download WebEx of this was a phone call.  It was about 12 minutes only      Wilhemena Durie, MD  [facility] Fritzi Mandes [place] as a scribe for Wilhemena Durie, MD.,have documented all relevant documentation on the behalf of Wilhemena Durie, MD,as directed by   Wilhemena Durie, MD while in the presence of Wilhemena Durie, MD.

## 2019-03-06 NOTE — Addendum Note (Signed)
Addended by: Minette Headland on: 03/06/2019 01:44 PM   Modules accepted: Orders

## 2019-03-07 DIAGNOSIS — E291 Testicular hypofunction: Secondary | ICD-10-CM | POA: Diagnosis not present

## 2019-03-08 LAB — PROLACTIN: Prolactin: 14.1 ng/mL (ref 4.0–15.2)

## 2019-03-09 ENCOUNTER — Other Ambulatory Visit: Payer: Self-pay | Admitting: Family Medicine

## 2019-03-09 DIAGNOSIS — I10 Essential (primary) hypertension: Secondary | ICD-10-CM

## 2019-03-09 NOTE — Telephone Encounter (Signed)
Please review

## 2019-03-12 ENCOUNTER — Other Ambulatory Visit: Payer: Self-pay

## 2019-03-12 ENCOUNTER — Telehealth: Payer: Self-pay | Admitting: *Deleted

## 2019-03-12 DIAGNOSIS — R7989 Other specified abnormal findings of blood chemistry: Secondary | ICD-10-CM

## 2019-03-12 MED ORDER — CLOMIPHENE CITRATE 50 MG PO TABS: 50.0000 mg | ORAL_TABLET | Freq: Every day | ORAL | 1 refills | Status: DC

## 2019-03-12 MED ORDER — CLOMIPHENE CITRATE 50 MG PO TABS: 50.0000 mg | ORAL_TABLET | Freq: Every day | ORAL | Status: DC

## 2019-03-12 NOTE — Telephone Encounter (Signed)
[facility] called for clarification on the Clomid rx? Please advise?

## 2019-03-13 NOTE — Telephone Encounter (Signed)
Please advise 

## 2019-03-14 MED ORDER — CLOMIPHENE CITRATE 50 MG PO TABS: 25.0000 mg | ORAL_TABLET | Freq: Every day | ORAL | 1 refills | Status: DC

## 2019-03-14 NOTE — Telephone Encounter (Signed)
It should be 50 mg, 1/2 tablet daily. For hypogonadism

## 2019-03-14 NOTE — Telephone Encounter (Signed)
Corrected Rx sent into the pharmacy.

## 2019-03-21 ENCOUNTER — Encounter: Payer: Self-pay | Admitting: Family Medicine

## 2019-05-08 DIAGNOSIS — M1712 Unilateral primary osteoarthritis, left knee: Secondary | ICD-10-CM | POA: Diagnosis not present

## 2019-05-08 DIAGNOSIS — E669 Obesity, unspecified: Secondary | ICD-10-CM | POA: Diagnosis not present

## 2019-05-15 DIAGNOSIS — B360 Pityriasis versicolor: Secondary | ICD-10-CM | POA: Diagnosis not present

## 2019-05-26 ENCOUNTER — Other Ambulatory Visit: Payer: Self-pay | Admitting: Family Medicine

## 2019-05-26 DIAGNOSIS — N400 Enlarged prostate without lower urinary tract symptoms: Secondary | ICD-10-CM

## 2019-07-16 ENCOUNTER — Other Ambulatory Visit: Payer: Self-pay | Admitting: Family Medicine

## 2019-07-17 ENCOUNTER — Other Ambulatory Visit: Payer: Self-pay

## 2019-07-17 ENCOUNTER — Encounter: Payer: Self-pay | Admitting: Family Medicine

## 2019-07-17 ENCOUNTER — Ambulatory Visit (INDEPENDENT_AMBULATORY_CARE_PROVIDER_SITE_OTHER): Payer: BC Managed Care – PPO | Admitting: Family Medicine

## 2019-07-17 VITALS — BP 136/82 | HR 72 | Temp 98.2°F | Resp 16 | Ht 74.0 in | Wt 283.0 lb

## 2019-07-17 DIAGNOSIS — G4733 Obstructive sleep apnea (adult) (pediatric): Secondary | ICD-10-CM

## 2019-07-17 DIAGNOSIS — E291 Testicular hypofunction: Secondary | ICD-10-CM | POA: Diagnosis not present

## 2019-07-17 DIAGNOSIS — Z0000 Encounter for general adult medical examination without abnormal findings: Secondary | ICD-10-CM | POA: Diagnosis not present

## 2019-07-17 DIAGNOSIS — I872 Venous insufficiency (chronic) (peripheral): Secondary | ICD-10-CM

## 2019-07-17 DIAGNOSIS — I10 Essential (primary) hypertension: Secondary | ICD-10-CM

## 2019-07-17 DIAGNOSIS — E669 Obesity, unspecified: Secondary | ICD-10-CM

## 2019-07-17 NOTE — Progress Notes (Signed)
Patient: Darrell Montes., Male    DOB: [birth year], 58 y.o.   MRN: [id number] Visit Date: 07/17/2019  Today's Provider: Wilhemena Durie, MD   Chief Complaint  Patient presents with  . Annual Exam   Subjective:     Annual physical exam Darrell Montes. is a 58 y.o. male who presents today for health maintenance and complete physical. He feels well. He reports exercising not regularly. He reports he is sleeping well.  Colonoscopy- 01/10/2012. Diverticulosis, otherwise normal. Repeat in 10 yrs.  Tdap- 04/28/2011.    Review of Systems  Constitutional: Negative.   HENT: Negative.   Eyes: Negative.   Respiratory: Negative.   Cardiovascular: Positive for leg swelling.  Gastrointestinal: Negative.   Endocrine: Negative.   Genitourinary: Negative.   Musculoskeletal: Negative.   Skin: Negative.   Allergic/Immunologic: Negative.   Neurological: Negative.   Hematological: Negative.   Psychiatric/Behavioral: Negative.     Social History      He  reports that he has never smoked. He has never used smokeless tobacco. He reports current alcohol use of about 3.0 standard drinks of alcohol per week. He reports that he does not use drugs.       Social History   Socioeconomic History  . Marital status: Single    Spouse name: Not on file  . Number of children: 2  . Years of education: Not on file  . Highest education level: Not on file  Occupational History  . Occupation: tech support  Social Needs  . Financial resource strain: Not very hard  . Food insecurity    Worry: Never true    Inability: Never true  . Transportation needs    Medical: Not on file    Non-medical: Not on file  Tobacco Use  . Smoking status: Never Smoker  . Smokeless tobacco: Never Used  Substance and Sexual Activity  . Alcohol use: Yes    Alcohol/week: 3.0 standard drinks    Types: 3 Standard drinks or equivalent per week    Comment: socially  . Drug use: No  . Sexual activity: Not on file   Lifestyle  . Physical activity    Days per week: Not on file    Minutes per session: Not on file  . Stress: Not on file  Relationships  . Social Herbalist on phone: Not on file    Gets together: Not on file    Attends religious service: Not on file    Active member of club or organization: Not on file    Attends meetings of clubs or organizations: Not on file    Relationship status: Not on file  Other Topics Concern  . Not on file  Social History Narrative  . Not on file    Past Medical History:  Diagnosis Date  . Hx of blood clots   . Hypertension      Patient Active Problem List   Diagnosis Date Noted  . Primary osteoarthritis of left knee 06/28/2018  . Impingement syndrome of left shoulder 03/11/2017  . Allergic rhinitis 09/19/2015  . Acute thromboembolism of deep veins of lower extremity ([place]) 09/19/2015  . Essential (primary) hypertension 09/19/2015  . Acid reflux 09/19/2015  . HLD (hyperlipidemia) 09/19/2015  . Obesity (BMI 35.0-39.9 without comorbidity) 09/19/2015  . Obstructive apnea 09/19/2015  . Cannot sleep 09/19/2015  . Radial nerve disease 09/19/2015  . Cough 11/14/2013    Past Surgical History:  Procedure  Laterality Date  . BACK SURGERY     X2 lower disc   . KNEE SURGERY Left    X2 scope  . UPPER GI ENDOSCOPY  07/11/2013   normal duodenum, benign appearing esophageal stricture x2     Family History        Family Status  Relation Name Status  . Mother  Alive  . Father  Alive  . Daughter  Alive  . Daughter  Alive        His family history includes Bladder Cancer in his mother; COPD in his father and mother; Hypertension in his father; Sleep apnea in his father.      No Known Allergies   Current Outpatient Medications:  .  amLODipine (NORVASC) 5 MG tablet, TAKE 1 TABLET BY MOUTH ONCE DAILY., Disp: 30 tablet, Rfl: 11 .  aspirin 81 MG tablet, Take 81 mg by mouth daily., Disp: , Rfl:  .  clomiPHENE (CLOMID) 50 MG tablet, Take  0.5 tablets (25 mg total) by mouth daily., Disp: 30 tablet, Rfl: 1 .  loratadine (CLARITIN) 10 MG tablet, Take 1 tablet (10 mg total) by mouth daily., Disp: 90 tablet, Rfl: 3 .  meloxicam (MOBIC) 15 MG tablet, , Disp: , Rfl:  .  montelukast (SINGULAIR) 10 MG tablet, TAKE 1 TABLET BY MOUTH AT BEDTIME, Disp: 30 tablet, Rfl: 11 .  Multiple Vitamin tablet, Take by mouth., Disp: , Rfl:  .  pantoprazole (PROTONIX) 40 MG tablet, TAKE 1 TABLET BY MOUTH ONCE DAILY 30 MINUTES BEFORE EVENING MEAL, Disp: 90 tablet, Rfl: 3 .  tamsulosin (FLOMAX) 0.4 MG CAPS capsule, TAKE 1 CAPSULE BY MOUTH ONCE DAILY, Disp: 90 capsule, Rfl: 3 .  traZODone (DESYREL) 100 MG tablet, TAKE 1 TABLET BY MOUTH AT BEDTIME, Disp: 30 tablet, Rfl: 11   Patient Care Team: Jerrol Banana., MD as PCP - General (Family Medicine)    Objective:    Vitals: BP 136/82   Pulse 72   Temp 98.2 F (36.8 C)   Resp 16   Ht 6\' 2"  (1.88 m)   Wt 283 lb (128.4 kg)   SpO2 97%   BMI 36.34 kg/m    Vitals:   07/17/19 0941  BP: 136/82  Pulse: 72  Resp: 16  Temp: 98.2 F (36.8 C)  SpO2: 97%  Weight: 283 lb (128.4 kg)  Height: 6\' 2"  (1.88 m)     Physical Exam Vitals signs reviewed.  Constitutional:      Appearance: He is well-developed.  HENT:     Head: Normocephalic and atraumatic.     Right Ear: External ear normal.     Left Ear: External ear normal.     Nose: Nose normal.  Eyes:     Conjunctiva/sclera: Conjunctivae normal.     Pupils: Pupils are equal, round, and reactive to light.  Neck:     Musculoskeletal: Normal range of motion and neck supple.  Cardiovascular:     Rate and Rhythm: Normal rate and regular rhythm.     Heart sounds: Normal heart sounds.  Pulmonary:     Effort: Pulmonary effort is normal.     Breath sounds: Normal breath sounds.  Abdominal:     General: Bowel sounds are normal.     Palpations: Abdomen is soft.  Genitourinary:    Penis: Normal.      Prostate: Normal.     Rectum: Normal.   Musculoskeletal: Normal range of motion.     Right lower leg: Edema present.  Left lower leg: Edema present.     Comments: 1+ edema.Chronic venous insufficiency.  Skin:    General: Skin is warm and dry.     Comments: Lipoma left upper arm. Small sebaceous cyst right chin.  Neurological:     General: No focal deficit present.     Mental Status: He is alert and oriented to person, place, and time.  Psychiatric:        Mood and Affect: Mood normal.        Behavior: Behavior normal.        Thought Content: Thought content normal.        Judgment: Judgment normal.      Depression Screen PHQ 2/9 Scores 07/17/2019 02/26/2019 03/08/2018 03/07/2017  PHQ - 2 Score 0 0 0 0  PHQ- 9 Score - - - 0       Assessment & Plan:     Routine Health Maintenance and Physical Exam  Exercise Activities and Dietary recommendations Goals   None     Immunization History  Administered Date(s) Administered  . Influenza Split 12/10/2010, 09/29/2011, 11/02/2012, 11/13/2013  . Influenza,inj,Quad PF,6+ Mos 11/13/2013, 10/01/2015, 08/24/2016, 09/06/2017, 09/13/2018  . Tdap 04/28/2011    Health Maintenance  Topic Date Due  . HIV Screening  07/20/1976  . INFLUENZA VACCINE  06/30/2019  . TETANUS/TDAP  04/27/2021  . COLONOSCOPY  01/09/2022  . Hepatitis C Screening  Completed     Discussed health benefits of physical activity, and encouraged him to engage in regular exercise appropriate for his age and condition.  1. Annual physical exam  - Comprehensive metabolic panel - Lipid panel - PSA - CBC with Differential/Platelet - TSH  2. Hypogonadism in male Check level. - Testosterone  3. Essential (primary) hypertension   4. Obstructive apnea   5. Obesity (BMI 35.0-39.9 without comorbidity)   6. Venous insufficiency (chronic) (peripheral) Refer to vascular.    Cranford Mon, MD  [facility]

## 2019-07-18 ENCOUNTER — Telehealth: Payer: Self-pay

## 2019-07-18 LAB — COMPREHENSIVE METABOLIC PANEL
ALT: 45 IU/L — ABNORMAL HIGH (ref 0–44)
AST: 39 IU/L (ref 0–40)
Albumin/Globulin Ratio: 1.9 (ref 1.2–2.2)
Albumin: 4.2 g/dL (ref 3.8–4.9)
Alkaline Phosphatase: 74 IU/L (ref 39–117)
BUN/Creatinine Ratio: 19 (ref 9–20)
BUN: 15 mg/dL (ref 6–24)
Bilirubin Total: 0.4 mg/dL (ref 0.0–1.2)
CO2: 28 mmol/L (ref 20–29)
Calcium: 9.5 mg/dL (ref 8.7–10.2)
Chloride: 101 mmol/L (ref 96–106)
Creatinine, Ser: 0.79 mg/dL (ref 0.76–1.27)
GFR calc Af Amer: 115 mL/min/{1.73_m2} (ref >59)
GFR calc non Af Amer: 100 mL/min/{1.73_m2} (ref >59)
Globulin, Total: 2.2 g/dL (ref 1.5–4.5)
Glucose: 97 mg/dL (ref 65–99)
Potassium: 4.2 mmol/L (ref 3.5–5.2)
Sodium: 142 mmol/L (ref 134–144)
Total Protein: 6.4 g/dL (ref 6.0–8.5)

## 2019-07-18 LAB — PSA: Prostate Specific Ag, Serum: 2.5 ng/mL (ref 0.0–4.0)

## 2019-07-18 LAB — CBC WITH DIFFERENTIAL/PLATELET
Basophils Absolute: 0 x10E3/uL (ref 0.0–0.2)
Basos: 1 %
EOS (ABSOLUTE): 0.2 x10E3/uL (ref 0.0–0.4)
Eos: 3 %
Hematocrit: 42.9 % (ref 37.5–51.0)
Hemoglobin: 14.1 g/dL (ref 13.0–17.7)
Immature Grans (Abs): 0.1 x10E3/uL (ref 0.0–0.1)
Immature Granulocytes: 1 %
Lymphocytes Absolute: 1 x10E3/uL (ref 0.7–3.1)
Lymphs: 15 %
MCH: 29.7 pg (ref 26.6–33.0)
MCHC: 32.9 g/dL (ref 31.5–35.7)
MCV: 90 fL (ref 79–97)
Monocytes Absolute: 0.4 x10E3/uL (ref 0.1–0.9)
Monocytes: 6 %
Neutrophils Absolute: 5.1 x10E3/uL (ref 1.4–7.0)
Neutrophils: 74 %
Platelets: 126 x10E3/uL — ABNORMAL LOW (ref 150–450)
RBC: 4.75 x10E6/uL (ref 4.14–5.80)
RDW: 13.3 % (ref 11.6–15.4)
WBC: 6.9 x10E3/uL (ref 3.4–10.8)

## 2019-07-18 LAB — TSH: TSH: 2.44 u[IU]/mL (ref 0.450–4.500)

## 2019-07-18 LAB — LIPID PANEL
Chol/HDL Ratio: 4.5 ratio (ref 0.0–5.0)
Cholesterol, Total: 183 mg/dL (ref 100–199)
HDL: 41 mg/dL (ref >39)
LDL Calculated: 111 mg/dL — ABNORMAL HIGH (ref 0–99)
Triglycerides: 155 mg/dL — ABNORMAL HIGH (ref 0–149)
VLDL Cholesterol Cal: 31 mg/dL (ref 5–40)

## 2019-07-18 LAB — TESTOSTERONE: Testosterone: 398 ng/dL (ref 264–916)

## 2019-07-18 NOTE — Telephone Encounter (Signed)
LVM regarding lab results 

## 2019-07-18 NOTE — Telephone Encounter (Signed)
-----   Message from Jerrol Banana., MD sent at 07/18/2019 10:39 AM EDT ----- Labs ok--testosterone better.

## 2019-07-18 NOTE — Telephone Encounter (Signed)
Pt called back for results.  Can call pt back and leave results on VM.  Thanks, [employer]

## 2019-07-18 NOTE — Telephone Encounter (Signed)
Advised patient of results.  

## 2019-07-21 DIAGNOSIS — I872 Venous insufficiency (chronic) (peripheral): Secondary | ICD-10-CM | POA: Insufficient documentation

## 2019-08-20 ENCOUNTER — Encounter (INDEPENDENT_AMBULATORY_CARE_PROVIDER_SITE_OTHER): Payer: Self-pay | Admitting: Vascular Surgery

## 2019-08-20 ENCOUNTER — Other Ambulatory Visit: Payer: Self-pay

## 2019-08-20 ENCOUNTER — Encounter (INDEPENDENT_AMBULATORY_CARE_PROVIDER_SITE_OTHER): Payer: Self-pay

## 2019-08-20 ENCOUNTER — Ambulatory Visit (INDEPENDENT_AMBULATORY_CARE_PROVIDER_SITE_OTHER): Payer: BC Managed Care – PPO | Admitting: Vascular Surgery

## 2019-08-20 VITALS — BP 151/91 | HR 87 | Resp 16 | Ht 74.0 in | Wt 280.4 lb

## 2019-08-20 DIAGNOSIS — I82542 Chronic embolism and thrombosis of left tibial vein: Secondary | ICD-10-CM

## 2019-08-20 DIAGNOSIS — I872 Venous insufficiency (chronic) (peripheral): Secondary | ICD-10-CM | POA: Diagnosis not present

## 2019-08-20 DIAGNOSIS — K219 Gastro-esophageal reflux disease without esophagitis: Secondary | ICD-10-CM | POA: Diagnosis not present

## 2019-08-20 DIAGNOSIS — M1712 Unilateral primary osteoarthritis, left knee: Secondary | ICD-10-CM

## 2019-08-20 DIAGNOSIS — E785 Hyperlipidemia, unspecified: Secondary | ICD-10-CM

## 2019-08-20 NOTE — Progress Notes (Signed)
MRN : [id number]  Anduin Kopitzke. is a 58 y.o. (February 26, 1961) male who presents with chief complaint of  Chief Complaint  Patient presents with  . New Patient (Initial Visit)    ref Rosanna Randy for venous insufficiency  .  History of Present Illness: Patient is seen for evaluation of left leg swelling. The patient first noticed the swelling remotely, he estimates about 5 years ago following knee surgery.  He denies any recent change or increase in the overall edema. The swelling is associated with "tightness" as the day goes on and he notes some discoloration of the ankle area. The patient notes that in the morning the legs are significantly improved but they steadily worsened throughout the course of the day. Elevation makes the legs better, dependency makes them much worse.   There is no history of ulcerations associated with the swelling.   The patient denies any recent changes in their medications.  The patient has not been wearing graduated compression.  The patient has no had any past angiography, interventions or vascular surgery.  He does have a history of DVT after a backsurgery many years ago.  It sounds from his history like it was tibial.  The patient denies a history of PE. There is no prior history of phlebitis. There is no history of primary lymphedema.  There is no history of radiation treatment to the groin or pelvis No history of malignancies. No history of trauma or groin or pelvic surgery. No history of foreign travel or parasitic infections area    Current Meds  Medication Sig  . amLODipine (NORVASC) 5 MG tablet TAKE 1 TABLET BY MOUTH ONCE DAILY.  Marland Kitchen aspirin 81 MG tablet Take 81 mg by mouth daily.  . clomiPHENE (CLOMID) 50 MG tablet TAKE 1/2 TABLET BY MOUTH ONCE DAILY  . loratadine (CLARITIN) 10 MG tablet Take 1 tablet (10 mg total) by mouth daily.  . meloxicam (MOBIC) 15 MG tablet   . montelukast (SINGULAIR) 10 MG tablet TAKE 1 TABLET BY MOUTH AT BEDTIME  .  Multiple Vitamin tablet Take by mouth.  . pantoprazole (PROTONIX) 40 MG tablet TAKE 1 TABLET BY MOUTH ONCE DAILY 30 MINUTES BEFORE EVENING MEAL  . tamsulosin (FLOMAX) 0.4 MG CAPS capsule TAKE 1 CAPSULE BY MOUTH ONCE DAILY  . traZODone (DESYREL) 100 MG tablet TAKE 1 TABLET BY MOUTH AT BEDTIME    Past Medical History:  Diagnosis Date  . Hx of blood clots   . Hypertension     Past Surgical History:  Procedure Laterality Date  . BACK SURGERY     X2 lower disc   . KNEE SURGERY Left    X2 scope  . UPPER GI ENDOSCOPY  07/11/2013   normal duodenum, benign appearing esophageal stricture x2     Social History Social History   Tobacco Use  . Smoking status: Never Smoker  . Smokeless tobacco: Never Used  Substance Use Topics  . Alcohol use: Yes    Alcohol/week: 3.0 standard drinks    Types: 3 Standard drinks or equivalent per week    Comment: socially  . Drug use: No    Family History Family History  Problem Relation Age of Onset  . COPD Mother   . Bladder Cancer Mother   . Hypertension Father   . COPD Father   . Sleep apnea Father   No family history of bleeding/clotting disorders, porphyria or autoimmune disease   No Known Allergies   REVIEW OF SYSTEMS (Negative unless  checked)  Constitutional: [] Weight loss  [] Fever  [] Chills Cardiac: [] Chest pain   [] Chest pressure   [] Palpitations   [] Shortness of breath when laying flat   [] Shortness of breath with exertion. Vascular:  [] Pain in legs with walking   [] Pain in legs at rest  [x] History of DVT   [] Phlebitis   [x] Swelling in legs   [x] Varicose veins   [] Non-healing ulcers Pulmonary:   [] Uses home oxygen   [] Productive cough   [] Hemoptysis   [] Wheeze  [] COPD   [] Asthma Neurologic:  [] Dizziness   [] Seizures   [] History of stroke   [] History of TIA  [] Aphasia   [] Vissual changes   [] Weakness or numbness in arm   [] Weakness or numbness in leg Musculoskeletal:   [] Joint swelling   [] Joint pain   [] Low back pain Hematologic:   [] Easy bruising  [] Easy bleeding   [] Hypercoagulable state   [] Anemic Gastrointestinal:  [] Diarrhea   [] Vomiting  [x] Gastroesophageal reflux/heartburn   [] Difficulty swallowing. Genitourinary:  [] Chronic kidney disease   [] Difficult urination  [] Frequent urination   [] Blood in urine Skin:  [] Rashes   [] Ulcers  Psychological:  [] History of anxiety   []  History of major depression.  Physical Examination  Vitals:   08/20/19 0851  BP: (!) 151/91  Pulse: 87  Resp: 16  Weight: 280 lb 6.4 oz (127.2 kg)  Height: 6\' 2"  (1.88 m)   Body mass index is 36 kg/m. Gen: WD/WN, NAD Head: [place]/AT, No temporalis wasting.  Ear/Nose/Throat: Hearing grossly intact, nares w/o erythema or drainage, poor dentition Eyes: PER, EOMI, sclera nonicteric.  Neck: Supple, no masses.  No bruit or JVD.  Pulmonary:  Good air movement, clear to auscultation bilaterally, no use of accessory muscles.  Cardiac: RRR, normal S1, S2, no Murmurs. Vascular: scattered varicosities present bilaterally.  Moderate venous stasis changes to the legs bilaterally left > right.  2+ soft pitting edema left > right Gastrointestinal: soft, non-distended. No guarding/no peritoneal signs.  Musculoskeletal: M/S 5/5 throughout.  No deformity or atrophy.  Neurologic: CN 2-12 intact. Pain and light touch intact in extremities.  Symmetrical.  Speech is fluent. Motor exam as listed above. Psychiatric: Judgment intact, Mood & affect appropriate for pt's clinical situation. Dermatologic: No rashes or ulcers noted.  No changes consistent with cellulitis. Lymph : No Cervical lymphadenopathy, no lichenification or skin changes of chronic lymphedema.  CBC Lab Results  Component Value Date   WBC 6.9 07/17/2019   HGB 14.1 07/17/2019   HCT 42.9 07/17/2019   MCV 90 07/17/2019   PLT 126 (L) 07/17/2019    BMET    Component Value Date/Time   NA 142 07/17/2019 1106   K 4.2 07/17/2019 1106   CL 101 07/17/2019 1106   CO2 28 07/17/2019 1106   GLUCOSE  97 07/17/2019 1106   BUN 15 07/17/2019 1106   CREATININE 0.79 07/17/2019 1106   CALCIUM 9.5 07/17/2019 1106   GFRNONAA 100 07/17/2019 1106   GFRAA 115 07/17/2019 1106   CrCl cannot be calculated (Patient's most recent lab result is older than the maximum 21 days allowed.).  COAG No results found for: INR, PROTIME  Radiology No results found.   Assessment/Plan 1. Venous insufficiency (chronic) (peripheral) No surgery or intervention at this point in time.  I have reviewed my discussion with the patient regarding venous insufficiency and why it causes symptoms. I have discussed with the patient the chronic skin changes that accompany venous insufficiency and the long term sequela such as ulceration. Patient will contnue wearing  graduated compression stockings on a daily basis, as this has provided excellent control of his edema. The patient will put the stockings on first thing in the morning and removing them in the evening. The patient is reminded not to sleep in the stockings.  In addition, behavioral modification including elevation during the day will be initiated. Exercise is strongly encouraged.  Given the patient's good control and lack of any problems regarding the venous insufficiency and lymphedema a lymph pump in not need at this time.  The patient will follow up with me PRN should anything change.  The patient voices agreement with this plan.   2. Chronic deep vein thrombosis (DVT) of tibial vein of left lower extremity (HCC) No surgery or intervention at this point in time.  I have reviewed my discussion with the patient regarding venous insufficiency and why it causes symptoms. I have discussed with the patient the chronic skin changes that accompany venous insufficiency and the long term sequela such as ulceration. Patient will contnue wearing graduated compression stockings on a daily basis, as this has provided excellent control of his edema. The patient will put the  stockings on first thing in the morning and removing them in the evening. The patient is reminded not to sleep in the stockings.  In addition, behavioral modification including elevation during the day will be initiated. Exercise is strongly encouraged.  Given the patient's good control and lack of any problems regarding the venous insufficiency and lymphedema a lymph pump in not need at this time.  The patient will follow up with me PRN should anything change.  The patient voices agreement with this plan.   3. Hyperlipidemia, unspecified hyperlipidemia type Continue statin as ordered and reviewed, no changes at this time   4. Gastroesophageal reflux disease, esophagitis presence not specified Continue PPI as already ordered, this medication has been reviewed and there are no changes at this time.  Avoidence of caffeine and alcohol  Moderate elevation of the head of the bed   5. Primary osteoarthritis of left knee Continue NSAID medications as already ordered, these medications have been reviewed and there are no changes at this time.  Continued activity and therapy was stressed.     Hortencia Pilar, MD  08/20/2019 9:26 AM

## 2019-08-27 ENCOUNTER — Other Ambulatory Visit: Payer: Self-pay | Admitting: Family Medicine

## 2019-09-07 DIAGNOSIS — Z8719 Personal history of other diseases of the digestive system: Secondary | ICD-10-CM | POA: Diagnosis not present

## 2019-09-07 DIAGNOSIS — Z01812 Encounter for preprocedural laboratory examination: Secondary | ICD-10-CM | POA: Diagnosis not present

## 2019-09-07 DIAGNOSIS — R1310 Dysphagia, unspecified: Secondary | ICD-10-CM | POA: Diagnosis not present

## 2019-09-07 DIAGNOSIS — K219 Gastro-esophageal reflux disease without esophagitis: Secondary | ICD-10-CM | POA: Diagnosis not present

## 2019-09-11 DIAGNOSIS — L299 Pruritus, unspecified: Secondary | ICD-10-CM | POA: Diagnosis not present

## 2019-09-11 DIAGNOSIS — B360 Pityriasis versicolor: Secondary | ICD-10-CM | POA: Diagnosis not present

## 2019-10-04 DIAGNOSIS — M7552 Bursitis of left shoulder: Secondary | ICD-10-CM | POA: Diagnosis not present

## 2019-10-04 DIAGNOSIS — E669 Obesity, unspecified: Secondary | ICD-10-CM | POA: Diagnosis not present

## 2019-10-05 DIAGNOSIS — Z01812 Encounter for preprocedural laboratory examination: Secondary | ICD-10-CM | POA: Diagnosis not present

## 2019-10-05 DIAGNOSIS — R1310 Dysphagia, unspecified: Secondary | ICD-10-CM | POA: Diagnosis not present

## 2019-10-09 DIAGNOSIS — K219 Gastro-esophageal reflux disease without esophagitis: Secondary | ICD-10-CM | POA: Diagnosis not present

## 2019-10-09 DIAGNOSIS — K222 Esophageal obstruction: Secondary | ICD-10-CM | POA: Diagnosis not present

## 2019-10-09 DIAGNOSIS — I10 Essential (primary) hypertension: Secondary | ICD-10-CM | POA: Diagnosis not present

## 2019-10-09 DIAGNOSIS — R1310 Dysphagia, unspecified: Secondary | ICD-10-CM | POA: Diagnosis not present

## 2019-10-11 DIAGNOSIS — M1712 Unilateral primary osteoarthritis, left knee: Secondary | ICD-10-CM | POA: Diagnosis not present

## 2019-11-06 DIAGNOSIS — Z20828 Contact with and (suspected) exposure to other viral communicable diseases: Secondary | ICD-10-CM | POA: Diagnosis not present

## 2019-11-06 DIAGNOSIS — Z23 Encounter for immunization: Secondary | ICD-10-CM | POA: Diagnosis not present

## 2019-11-06 DIAGNOSIS — I10 Essential (primary) hypertension: Secondary | ICD-10-CM | POA: Diagnosis not present

## 2019-11-24 DIAGNOSIS — Z20828 Contact with and (suspected) exposure to other viral communicable diseases: Secondary | ICD-10-CM | POA: Diagnosis not present

## 2019-12-11 ENCOUNTER — Other Ambulatory Visit: Payer: Self-pay | Admitting: Family Medicine

## 2019-12-11 DIAGNOSIS — R05 Cough: Secondary | ICD-10-CM

## 2019-12-11 DIAGNOSIS — R053 Chronic cough: Secondary | ICD-10-CM

## 2020-01-10 DIAGNOSIS — E669 Obesity, unspecified: Secondary | ICD-10-CM | POA: Diagnosis not present

## 2020-01-10 DIAGNOSIS — M1712 Unilateral primary osteoarthritis, left knee: Secondary | ICD-10-CM | POA: Diagnosis not present

## 2020-03-17 ENCOUNTER — Other Ambulatory Visit: Payer: Self-pay | Admitting: Family Medicine

## 2020-03-17 DIAGNOSIS — I10 Essential (primary) hypertension: Secondary | ICD-10-CM

## 2020-03-18 ENCOUNTER — Other Ambulatory Visit: Payer: Self-pay

## 2020-03-18 ENCOUNTER — Encounter: Payer: Self-pay | Admitting: Family Medicine

## 2020-03-18 ENCOUNTER — Ambulatory Visit (INDEPENDENT_AMBULATORY_CARE_PROVIDER_SITE_OTHER): Payer: BC Managed Care – PPO | Admitting: Family Medicine

## 2020-03-18 VITALS — BP 148/96 | HR 92 | Temp 96.9°F | Wt 271.0 lb

## 2020-03-18 DIAGNOSIS — I10 Essential (primary) hypertension: Secondary | ICD-10-CM | POA: Diagnosis not present

## 2020-03-18 DIAGNOSIS — I872 Venous insufficiency (chronic) (peripheral): Secondary | ICD-10-CM

## 2020-03-18 DIAGNOSIS — E785 Hyperlipidemia, unspecified: Secondary | ICD-10-CM | POA: Diagnosis not present

## 2020-03-18 MED ORDER — AMLODIPINE BESYLATE 5 MG PO TABS: 5.0000 mg | ORAL_TABLET | Freq: Every day | ORAL | 3 refills | Status: DC

## 2020-03-18 NOTE — Assessment & Plan Note (Addendum)
Patient is currently on Amlodipine, has been without for 2 days. Have patient resume medications, will send to pharmacy.

## 2020-03-18 NOTE — Progress Notes (Signed)
Established patient visit   I,Elena D DeSanto,acting as a scribe for [employer], PA.,have documented all relevant documentation on the behalf of Vernie Murders, PA,as directed by  [employer], PA while in the presence of [employer], [place].  Patient: Darrell Montes.   DOB: Nov 03, 1961   59 y.o. Male  MRN: [id number] Visit Date: 03/18/2020  Today's healthcare provider: Vernie Murders, PA   Chief Complaint  Patient presents with  . Hypertension  . Hyperlipidemia   Subjective    HPI  Hypertension: Patient here for follow-up of elevated blood pressure. He is exercising and is adherent to low salt diet.  Blood pressure is not taken at home. Cardiac symptoms none. Patient denies none.  Cardiovascular risk factors: advanced age (older than 66 for men, 53 for women), dyslipidemia, hypertension and male gender. Use of agents associated with hypertension: none.  Extra stress since both parents recent death with history of COPD (both). He is an only child and has all the estate to handle alone.  BP Readings from Last 3 Encounters:  03/18/20 (!) 148/96  08/20/19 (!) 151/91  07/17/19 136/82    Dyslipidemia: Patient presents for evaluation of lipids.  A repeat fasting lipid profile is being done today.  The patient do not use medications currently for his lipids The patient is not known to have coexisting coronary artery disease.   Lab Results  Component Value Date   CHOL 183 07/17/2019   HDL 41 07/17/2019   LDLCALC 111 (H) 07/17/2019   TRIG 155 (H) 07/17/2019   CHOLHDL 4.5 07/17/2019   Cardiac Risk Factors Age > 45-male, > 55-male:  YES  +1  Smoking:   NO  Sig. family hx of CHD*:  NO  Hypertension:   YES  +1  Diabetes:   NO  HDL < 35:   NO  HDL > 59:   NO  Total: 2  *- Sig. family h/o CHD per NCEP = MI or sudden death at <59yo in  father or other 1st-degree male relative, or <65yo in mother or  other 1st-degree male relative  Past Medical History:  Diagnosis  Date  . Hx of blood clots   . Hypertension    Past Surgical History:  Procedure Laterality Date  . BACK SURGERY     X2 lower disc   . KNEE SURGERY Left    X2 scope  . UPPER GI ENDOSCOPY  07/11/2013   normal duodenum, benign appearing esophageal stricture x2    Family History  Problem Relation Age of Onset  . COPD Mother   . Bladder Cancer Mother   . Hypertension Father   . COPD Father   . Sleep apnea Father    No Known Allergies  Medications: Outpatient Medications Prior to Visit  Medication Sig  . amLODipine (NORVASC) 5 MG tablet TAKE 1 TABLET BY MOUTH ONCE DAILY.  Marland Kitchen aspirin 81 MG tablet Take 81 mg by mouth daily.  . clomiPHENE (CLOMID) 50 MG tablet TAKE 1/2 TABLET BY MOUTH ONCE DAILY  . loratadine (CLARITIN) 10 MG tablet Take 1 tablet (10 mg total) by mouth daily.  . meloxicam (MOBIC) 15 MG tablet   . montelukast (SINGULAIR) 10 MG tablet TAKE 1 TABLET BY MOUTH AT BEDTIME  . Multiple Vitamin tablet Take by mouth.  . pantoprazole (PROTONIX) 40 MG tablet TAKE 1 TABLET BY MOUTH ONCE DAILY  . tamsulosin (FLOMAX) 0.4 MG CAPS capsule TAKE 1 CAPSULE BY MOUTH ONCE DAILY  . traZODone (DESYREL) 100  MG tablet TAKE 1 TABLET BY MOUTH AT BEDTIME   No facility-administered medications prior to visit.    Review of Systems  Constitutional: Negative for chills and fatigue.  HENT: Negative for ear pain and sore throat.   Respiratory: Negative for cough and shortness of breath.   Cardiovascular: Positive for leg swelling (minimal). Negative for chest pain and palpitations.  Neurological: Negative for dizziness and headaches.     Objective    BP (!) 148/96 (BP Location: Right Arm, Patient Position: Sitting, Cuff Size: Normal)   Pulse 92   Temp (!) 96.9 F (36.1 C) (Skin)   Wt 271 lb (122.9 kg)   SpO2 96%   BMI 34.79 kg/m    Physical Exam Constitutional:      Appearance: Normal appearance. He is normal weight.  HENT:     Head: Normocephalic and atraumatic.     Right Ear:  Tympanic membrane, ear canal and external ear normal.     Left Ear: Tympanic membrane, ear canal and external ear normal.  Cardiovascular:     Rate and Rhythm: Normal rate and regular rhythm.     Pulses: Normal pulses.     Heart sounds: Normal heart sounds. No murmur.  Pulmonary:     Effort: Pulmonary effort is normal.     Breath sounds: Normal breath sounds.  Abdominal:     General: Abdomen is flat. Bowel sounds are normal.     Palpations: Abdomen is soft.  Musculoskeletal:     Cervical back: Normal range of motion and neck supple.     Right lower leg: No edema.     Left lower leg: Edema (1+ pitting edema, hx of clot in this leg) present.     Comments: Many superficial varicose veins left foot and a few in the calf.  Neurological:     Mental Status: He is alert.   Patient complains of some tingling in his feet    Simple Foot Form  03/18/2020  9:59 AM  Visual Inspection No deformities, no ulcerations, no other skin breakdown bilaterally: Yes Sensation Testing Intact to touch and monofilament testing bilaterally: Yes Pulse Check Posterior Tibialis and Dorsalis pulse intact bilaterally: Yes Comments Normal hair growth on lower extremities.  No corns or callous.   Left foot and ankle with varicose veins.    No results found for any visits on 03/18/20.   Assessment & Plan     Problem List Items Addressed This Visit      Cardiovascular and Mediastinum   Essential (primary) hypertension - Primary    Patient is currently on Amlodipine, has been without for 2 days. Have patient resume medications, will send to pharmacy. Continue to limit salt and caffeine use. Plan follow up pending lab reports.      Relevant Medications   amLODipine (NORVASC) 5 MG tablet   Other Relevant Orders   Comprehensive metabolic panel   CBC with Differential/Platelet     Other   HLD (hyperlipidemia)    Not on any medication currently. Has lost 12 lbs with an increase in exercise level. Recheck  labs and follow low fat diet.      Relevant Medications   amLODipine (NORVASC) 5 MG tablet   Other Relevant Orders   Comprehensive metabolic panel   Lipid Panel With LDL/HDL Ratio        Venous insufficiency (chronic) (peripheral)         Large amount of superficial varicose veins in the left foot  Pitting edema to mid calf on the left.          Some early peripheral tingling (?early neuropathy?).         Use support hose and elevation prn.  No follow-ups on file.      Andres Shad, PA, have reviewed all documentation for this visit. The documentation on 03/18/20 for the exam, diagnosis, procedures, and orders are all accurate and complete.    Vernie Murders, [place] [phone] (phone) [phone] (fax)  [place]

## 2020-03-18 NOTE — Assessment & Plan Note (Signed)
Not on any medication currently 

## 2020-03-19 DIAGNOSIS — E785 Hyperlipidemia, unspecified: Secondary | ICD-10-CM | POA: Diagnosis not present

## 2020-03-19 DIAGNOSIS — I10 Essential (primary) hypertension: Secondary | ICD-10-CM | POA: Diagnosis not present

## 2020-03-20 ENCOUNTER — Telehealth: Payer: Self-pay

## 2020-03-20 LAB — COMPREHENSIVE METABOLIC PANEL
ALT: 43 IU/L (ref 0–44)
AST: 48 IU/L — ABNORMAL HIGH (ref 0–40)
Albumin/Globulin Ratio: 2.2 (ref 1.2–2.2)
Albumin: 4.3 g/dL (ref 3.8–4.9)
Alkaline Phosphatase: 71 IU/L (ref 39–117)
BUN/Creatinine Ratio: 21 — ABNORMAL HIGH (ref 9–20)
BUN: 15 mg/dL (ref 6–24)
Bilirubin Total: 0.4 mg/dL (ref 0.0–1.2)
CO2: 22 mmol/L (ref 20–29)
Calcium: 9.2 mg/dL (ref 8.7–10.2)
Chloride: 105 mmol/L (ref 96–106)
Creatinine, Ser: 0.73 mg/dL — ABNORMAL LOW (ref 0.76–1.27)
GFR calc Af Amer: 118 mL/min/{1.73_m2} (ref >59)
GFR calc non Af Amer: 102 mL/min/{1.73_m2} (ref >59)
Globulin, Total: 2 g/dL (ref 1.5–4.5)
Glucose: 101 mg/dL — ABNORMAL HIGH (ref 65–99)
Potassium: 4.3 mmol/L (ref 3.5–5.2)
Sodium: 145 mmol/L — ABNORMAL HIGH (ref 134–144)
Total Protein: 6.3 g/dL (ref 6.0–8.5)

## 2020-03-20 LAB — CBC WITH DIFFERENTIAL/PLATELET
Basophils Absolute: 0 10*3/uL (ref 0.0–0.2)
Basos: 1 %
EOS (ABSOLUTE): 0.3 10*3/uL (ref 0.0–0.4)
Eos: 5 %
Hematocrit: 43.3 % (ref 37.5–51.0)
Hemoglobin: 14 g/dL (ref 13.0–17.7)
Immature Grans (Abs): 0 10*3/uL (ref 0.0–0.1)
Immature Granulocytes: 1 %
Lymphocytes Absolute: 0.8 10*3/uL (ref 0.7–3.1)
Lymphs: 14 %
MCH: 29.7 pg (ref 26.6–33.0)
MCHC: 32.3 g/dL (ref 31.5–35.7)
MCV: 92 fL (ref 79–97)
Monocytes Absolute: 0.4 10*3/uL (ref 0.1–0.9)
Monocytes: 6 %
Neutrophils Absolute: 4.3 10*3/uL (ref 1.4–7.0)
Neutrophils: 73 %
Platelets: 129 10*3/uL — ABNORMAL LOW (ref 150–450)
RBC: 4.72 x10E6/uL (ref 4.14–5.80)
RDW: 13.4 % (ref 11.6–15.4)
WBC: 5.8 10*3/uL (ref 3.4–10.8)

## 2020-03-20 LAB — LIPID PANEL WITH LDL/HDL RATIO
Cholesterol, Total: 167 mg/dL (ref 100–199)
HDL: 36 mg/dL — ABNORMAL LOW (ref >39)
LDL Chol Calc (NIH): 99 mg/dL (ref 0–99)
LDL/HDL Ratio: 2.8 ratio (ref 0.0–3.6)
Triglycerides: 184 mg/dL — ABNORMAL HIGH (ref 0–149)
VLDL Cholesterol Cal: 32 mg/dL (ref 5–40)

## 2020-03-20 NOTE — Telephone Encounter (Signed)
Advised 

## 2020-03-20 NOTE — Telephone Encounter (Signed)
LMTCB, okay for P[facility] to advise

## 2020-03-20 NOTE — Telephone Encounter (Signed)
-----   Message from Margo Common, [place] sent at 03/20/2020  1:26 PM EDT ----- Triglycerides are above the goal of <150 and HDL slightly low. Total cholesterol and LDL are normal. Normal kidney function but sodium slightly up. Should restrict salt intake and drink plenty of fluids/water. Recommend a low fat diet and recheck levels in 6 months to assess progress. Continue present medications.

## 2020-04-04 ENCOUNTER — Other Ambulatory Visit: Payer: Self-pay | Admitting: Family Medicine

## 2020-04-04 NOTE — Telephone Encounter (Signed)
Requested medication (s) are due for refill today: yes  Requested medication (s) are on the active medication list: yes  Last refill:  03/07/20  Future visit scheduled: no  Notes to clinic:  no assigned protocol    Requested Prescriptions  Pending Prescriptions Disp Refills   clomiPHENE (CLOMID) 50 MG tablet [Pharmacy Med Name: CLOMIPHENE CITRATE 50 MG TAB] 30 tablet 3    Sig: TAKE 1/2 TABLET BY MOUTH ONCE DAILY      Off-Protocol Failed - 04/04/2020 10:42 AM      Failed - Medication not assigned to a protocol, review manually.      Passed - Valid encounter within last 12 months    Recent Outpatient Visits           2 weeks ago Essential (primary) hypertension   [facility], Vickki Muff, [place]   8 months ago Annual physical exam   [facility] Jerrol Banana., [place]   1 year ago Benign prostatic hyperplasia with nocturia   [facility] Jerrol Banana., [place]   1 year ago Frequency of urination   [facility] Jerrol Banana., [place]   1 year ago Need for influenza vaccination   [facility] Jerrol Banana., [place]

## 2020-04-30 ENCOUNTER — Other Ambulatory Visit: Payer: Self-pay | Admitting: Family Medicine

## 2020-04-30 DIAGNOSIS — N400 Enlarged prostate without lower urinary tract symptoms: Secondary | ICD-10-CM

## 2020-04-30 NOTE — Telephone Encounter (Signed)
Pt called back stating that he is still taking this medication. Please advise.

## 2020-04-30 NOTE — Telephone Encounter (Signed)
See response from patient. KW

## 2020-05-01 NOTE — Telephone Encounter (Signed)
Advised pharmacy patient is still taking Flomax.

## 2020-05-07 DIAGNOSIS — E669 Obesity, unspecified: Secondary | ICD-10-CM | POA: Diagnosis not present

## 2020-05-07 DIAGNOSIS — M1712 Unilateral primary osteoarthritis, left knee: Secondary | ICD-10-CM | POA: Diagnosis not present

## 2020-06-10 ENCOUNTER — Other Ambulatory Visit: Payer: Self-pay | Admitting: Family Medicine

## 2020-06-10 NOTE — Telephone Encounter (Signed)
Requested medication (s) are due for refill today -yes  Requested medication (s) are on the active medication list -yes  Future visit scheduled -no  Last refill: 04/07/20  Notes to clinic: Request for non delegated Rx  Requested Prescriptions  Pending Prescriptions Disp Refills   clomiPHENE (CLOMID) 50 MG tablet [Pharmacy Med Name: CLOMIPHENE CITRATE 50 MG TAB] 30 tablet 0    Sig: TAKE 1/2 TABLET BY MOUTH ONCE DAILY      Off-Protocol Failed - 06/10/2020  3:08 PM      Failed - Medication not assigned to a protocol, review manually.      Passed - Valid encounter within last 12 months    Recent Outpatient Visits           2 months ago Essential (primary) hypertension   [facility], Vickki Muff, [place]   10 months ago Annual physical exam   [facility] Jerrol Banana., [place]   1 year ago Benign prostatic hyperplasia with nocturia   [facility] Jerrol Banana., [place]   1 year ago Frequency of urination   [facility] Jerrol Banana., [place]   1 year ago Need for influenza vaccination   [facility] Jerrol Banana., MD                  Requested Prescriptions  Pending Prescriptions Disp Refills   clomiPHENE (CLOMID) 50 MG tablet [Pharmacy Med Name: CLOMIPHENE CITRATE 50 MG TAB] 30 tablet 0    Sig: TAKE 1/2 TABLET BY MOUTH ONCE DAILY      Off-Protocol Failed - 06/10/2020  3:08 PM      Failed - Medication not assigned to a protocol, review manually.      Passed - Valid encounter within last 12 months    Recent Outpatient Visits           2 months ago Essential (primary) hypertension   [facility], Vickki Muff, [place]   10 months ago Annual physical exam   [facility] Jerrol Banana., [place]   1 year ago Benign prostatic hyperplasia with nocturia   [facility] Jerrol Banana., MD   1 year ago Frequency of urination    [facility] Jerrol Banana., MD   1 year ago Need for influenza vaccination   [facility] Jerrol Banana., MD

## 2020-07-10 ENCOUNTER — Other Ambulatory Visit: Payer: Self-pay | Admitting: Family Medicine

## 2020-08-06 DIAGNOSIS — M25552 Pain in left hip: Secondary | ICD-10-CM | POA: Diagnosis not present

## 2020-10-07 ENCOUNTER — Other Ambulatory Visit: Payer: Self-pay | Admitting: Family Medicine

## 2021-01-20 ENCOUNTER — Other Ambulatory Visit: Payer: Self-pay | Admitting: Family Medicine

## 2021-03-20 ENCOUNTER — Other Ambulatory Visit: Payer: Self-pay | Admitting: Family Medicine

## 2021-03-20 DIAGNOSIS — R053 Chronic cough: Secondary | ICD-10-CM

## 2021-03-31 ENCOUNTER — Other Ambulatory Visit: Payer: Self-pay | Admitting: Family Medicine

## 2021-03-31 DIAGNOSIS — I10 Essential (primary) hypertension: Secondary | ICD-10-CM

## 2021-03-31 NOTE — Telephone Encounter (Signed)
Requested medications are due for refill today.  unknown  Requested medications are on the active medications list.  yes  Last refill. 03/18/2020  Future visit scheduled.   no  Notes to clinic.  Prescription is expired. Pt is more than 3 months overdue for office visit.

## 2021-04-01 NOTE — Telephone Encounter (Signed)
Please review. Ok to refill?  

## 2021-07-23 ENCOUNTER — Other Ambulatory Visit: Payer: Self-pay | Admitting: Family Medicine

## 2021-07-23 DIAGNOSIS — I10 Essential (primary) hypertension: Secondary | ICD-10-CM

## 2021-07-23 NOTE — Telephone Encounter (Signed)
  Notes to clinic:  Looks like patient is with [facility] for care  Attempted to contact no answer    Requested Prescriptions  Pending Prescriptions Disp Refills   amLODipine (NORVASC) 5 MG tablet [Pharmacy Med Name: AMLODIPINE BESYLATE 5 MG TAB] 90 tablet 0    Sig: TAKE 1 TABLET BY MOUTH ONCE DAILY     Cardiovascular:  Calcium Channel Blockers Failed - 07/23/2021 10:41 AM      Failed - Last BP in normal range    BP Readings from Last 1 Encounters:  03/18/20 (!) 148/96          Failed - Valid encounter within last 6 months    Recent Outpatient Visits           1 year ago Essential (primary) hypertension   [facility], Vickki Muff, PA-C   2 years ago Annual physical exam   [facility] Jerrol Banana., MD   2 years ago Benign prostatic hyperplasia with nocturia   [facility] Jerrol Banana., MD   2 years ago Frequency of urination   [facility] Jerrol Banana., MD   2 years ago Need for influenza vaccination   [facility] Jerrol Banana., MD

## 2021-09-23 ENCOUNTER — Other Ambulatory Visit: Payer: Self-pay | Admitting: Family Medicine

## 2021-09-23 DIAGNOSIS — R053 Chronic cough: Secondary | ICD-10-CM

## 2021-12-23 DIAGNOSIS — M67951 Unspecified disorder of synovium and tendon, right thigh: Secondary | ICD-10-CM | POA: Diagnosis not present

## 2022-03-10 DIAGNOSIS — M25561 Pain in right knee: Secondary | ICD-10-CM | POA: Diagnosis not present

## 2022-03-10 DIAGNOSIS — M2391 Unspecified internal derangement of right knee: Secondary | ICD-10-CM | POA: Diagnosis not present

## 2022-03-10 DIAGNOSIS — M25461 Effusion, right knee: Secondary | ICD-10-CM | POA: Diagnosis not present

## 2022-03-10 DIAGNOSIS — E669 Obesity, unspecified: Secondary | ICD-10-CM | POA: Diagnosis not present

## 2022-03-22 ENCOUNTER — Ambulatory Visit: Payer: Self-pay | Admitting: *Deleted

## 2022-03-22 NOTE — Telephone Encounter (Signed)
?  Chief Complaint: left foot swelling,pain ?Symptoms: swelling, pain, redness ?Frequency: 1 week ?Pertinent Negatives: Patient denies fever ?Disposition: '[]'$ ED /'[]'$ Urgent Care (no appt availability in office) / '[x]'$ Appointment(In office/virtual)/ '[]'$  [place] [facility]/ '[]'$ Home Care/ '[]'$ Refused Recommended Disposition /'[]'$ [place] [facility]/ '[]'$  Follow-up with PCP ?Additional Notes: Patient advised would send message for reveiw  ?

## 2022-03-22 NOTE — Telephone Encounter (Signed)
Reason for Disposition ? [1] Redness of the skin AND [2] no fever ? ?Answer Assessment - Initial Assessment Questions ?1. ONSET: "When did the pain start?"  ?    Gradual over time- past week worse ?2. LOCATION: "Where is the pain located?"  ?    Left foot ?3. PAIN: "How bad is the pain?"    (Scale 1-10; or mild, moderate, severe) ? - MILD (1-3): doesn't interfere with normal activities.  ? - MODERATE (4-7): interferes with normal activities (e.g., work or school) or awakens from sleep, limping.  ? - SEVERE (8-10): excruciating pain, unable to do any normal activities, unable to walk.  ?    moderate ?4. WORK OR EXERCISE: "Has there been any recent work or exercise that involved this part of the body?"  ?    no ?5. CAUSE: "What do you think is causing the foot pain?" ?    Possible gout ?6. OTHER SYMPTOMS: "Do you have any other symptoms?" (e.g., leg pain, rash, fever, numbness) ?    Pain, burning, tingling, redness, swelling ?7. PREGNANCY: "Is there any chance you are pregnant?" "When was your last menstrual period?" ? ?Protocols used: Foot Pain-A-AH ? ?

## 2022-03-22 NOTE — Progress Notes (Signed)
?  ? ?I,Darrell Montes,acting as a Education administrator for [employer], PA-C.,have documented all relevant documentation on the behalf of Mikey Kirschner, PA-C,as directed by  Mikey Kirschner, PA-C while in the presence of Mikey Kirschner, PA-C. ? ? ?Established patient visit ? ? ?Patient: Darrell Montes.   DOB: 1961/02/04   61 y.o. Male  MRN: [id number] ?Visit Date: 03/23/2022 ? ?Today's healthcare provider: Mikey Kirschner, PA-C  ? ?Cc. Left toe/foot pain x 3 weeks ? ?Subjective  ?  ?HPI  ?[name] is a 61 y/o male who presents today for left foot pain, swelling, redness x 3-4 weeks. He thinks it may be gout. Difficult to walk, pain with movement of L great toe.  ?H/o DVT left leg s/p procedure that left him with edema of the left leg since. Denies any follow up . ?Controlling pain with ASA or tylenol. Pain is worst at the end of the day.  ? ?He also has questions about weight loss, was gaining weight despite diet changes and frequent exercise before this foot pain. Questions about medications.  ? ?Medications: ?Outpatient Medications Prior to Visit  ?Medication Sig  ? amLODipine (NORVASC) 5 MG tablet TAKE 1 TABLET BY MOUTH ONCE DAILY  ? etodolac (LODINE) 500 MG tablet Take 500 mg by mouth 2 (two) times daily.  ? loratadine (CLARITIN) 10 MG tablet Take 1 tablet (10 mg total) by mouth daily.  ? montelukast (SINGULAIR) 10 MG tablet Take 1 tablet (10 mg total) by mouth at bedtime. Please schedule an office visit before anymore refills.  ? Multiple Vitamin tablet Take by mouth.  ? pantoprazole (PROTONIX) 40 MG tablet TAKE 1 TABLET BY MOUTH ONCE DAILY  ? tamsulosin (FLOMAX) 0.4 MG CAPS capsule TAKE 1 CAPSULE BY MOUTH ONCE DAILY  ? traZODone (DESYREL) 100 MG tablet TAKE 1 TABLET BY MOUTH AT BEDTIME  ? [DISCONTINUED] aspirin 81 MG tablet Take 81 mg by mouth daily.  ? [DISCONTINUED] clomiPHENE (CLOMID) 50 MG tablet TAKE 1/2 TABLET BY MOUTH ONCE DAILY  ? [DISCONTINUED] meloxicam (MOBIC) 15 MG tablet   ? ?No facility-administered medications  prior to visit.  ? ? ?Review of Systems  ?Constitutional:  Negative for fatigue and fever.  ?Respiratory:  Negative for cough and shortness of breath.   ?Cardiovascular:  Positive for leg swelling. Negative for chest pain and palpitations.  ?Skin:  Positive for color change and rash.  ?Neurological:  Negative for dizziness and headaches.  ? ? ?  Objective  ?  ?Blood pressure (!) 145/84, pulse 74, temperature 98 ?F (36.7 ?C), temperature source Oral, height '6\' 2"'$  (1.88 m), weight 291 lb 8 oz (132.2 kg), SpO2 100 %.  ? ?Physical Exam ?Constitutional:   ?   General: He is awake.  ?   Appearance: He is well-developed.  ?HENT:  ?   Head: Normocephalic.  ?Eyes:  ?   Conjunctiva/sclera: Conjunctivae normal.  ?Cardiovascular:  ?   Rate and Rhythm: Normal rate and regular rhythm.  ?   Pulses:     ?     Dorsalis pedis pulses are 3+ on the right side and 3+ on the left side.  ?   Heart sounds: Normal heart sounds.  ?Pulmonary:  ?   Effort: Pulmonary effort is normal.  ?   Breath sounds: Normal breath sounds.  ?Musculoskeletal:  ?   Left lower leg: Swelling present. 3+ Pitting Edema present.  ?Feet:  ?   Left foot:  ?   Skin integrity: Ulcer, skin breakdown, erythema and  warmth present.  ?   Comments: L great toe plantar surface with an ulceration  ?Erythema to L great toe extending along medial side of left foot ?Some pain with movement limited tenderness to touch ?Skin: ?   General: Skin is warm.  ?Neurological:  ?   Mental Status: He is alert and oriented to person, place, and time.  ?Psychiatric:     ?   Attention and Perception: Attention normal.     ?   Mood and Affect: Mood normal.     ?   Speech: Speech normal.     ?   Behavior: Behavior is cooperative.  ?  ? ?No results found for any visits on 03/23/22. ? Assessment & Plan  ?  ? ?Problem List Items Addressed This Visit   ? ?  ? Other  ? BMI 37.0-37.9, adult  ?  Discussed different weight loss medications ?Would be a candidate for wegovy, BMI of 37, comorbidity of HTN,  HLD ?Discussed form of medication, MOA, titration of dosing, side effects ? ?  ?  ? Relevant Medications  ? Semaglutide-Weight Management (WEGOVY) 0.25 MG/0.5ML SOAJ  ? Skin ulcer of left great toe ([place])  ?  Advised to keep clean and dry, wash w/ soap and water ?Urgent ref to podiatry for potential debridement ?Ordered xray L foot to r/o osteomyelitis  ?Discussed likely will need vascular ref if swelling/decreased sensation is from injury during dvt procedure ? ?  ?  ? Relevant Medications  ? amoxicillin-clavulanate (AUGMENTIN) 875-125 MG tablet  ? Other Relevant Orders  ? DG Foot Complete Left  ? Ambulatory referral to Podiatry  ? Cellulitis of great toe of left foot - Primary  ?  Rx augmentin 875 mg bid x 10 days ?See ulcer note ? ?  ?  ? Relevant Medications  ? amoxicillin-clavulanate (AUGMENTIN) 875-125 MG tablet  ? Other Relevant Orders  ? DG Foot Complete Left  ? Ambulatory referral to Podiatry  ? ?Other Visit Diagnoses   ? ? Encounter for screening for malignant neoplasm of colon      ? Relevant Orders  ? Ambulatory referral to Gastroenterology  ? ?  ?  ? ?Return in about 8 weeks (around 05/18/2022) for weight Management.  ?   ? ?I, Mikey Kirschner, PA-C have reviewed all documentation for this visit. The documentation on  03/23/2022 for the exam, diagnosis, procedures, and orders are all accurate and complete. ? ?Mikey Kirschner, PA-C ?[facility] ?[address] ?[place], [place], [address] ?Office: [phone] ?Fax: [phone]  ? ?[facility] ? ?

## 2022-03-23 ENCOUNTER — Ambulatory Visit (INDEPENDENT_AMBULATORY_CARE_PROVIDER_SITE_OTHER): Payer: 59 | Admitting: Physician Assistant

## 2022-03-23 ENCOUNTER — Encounter: Payer: Self-pay | Admitting: Physician Assistant

## 2022-03-23 VITALS — BP 145/84 | HR 74 | Temp 98.0°F | Ht 74.0 in | Wt 291.5 lb

## 2022-03-23 DIAGNOSIS — L97529 Non-pressure chronic ulcer of other part of left foot with unspecified severity: Secondary | ICD-10-CM

## 2022-03-23 DIAGNOSIS — Z1211 Encounter for screening for malignant neoplasm of colon: Secondary | ICD-10-CM

## 2022-03-23 DIAGNOSIS — Z6837 Body mass index (BMI) 37.0-37.9, adult: Secondary | ICD-10-CM | POA: Diagnosis not present

## 2022-03-23 DIAGNOSIS — L03032 Cellulitis of left toe: Secondary | ICD-10-CM | POA: Diagnosis not present

## 2022-03-23 MED ORDER — WEGOVY 0.25 MG/0.5ML ~~LOC~~ SOAJ: 0.2500 mg | SUBCUTANEOUS | 0 refills | Status: DC

## 2022-03-23 MED ORDER — AMOXICILLIN-POT CLAVULANATE 875-125 MG PO TABS: 1.0000 | ORAL_TABLET | Freq: Two times a day (BID) | ORAL | 0 refills | Status: DC

## 2022-03-23 NOTE — Assessment & Plan Note (Addendum)
Discussed different weight loss medications ?Would be a candidate for wegovy, BMI of 37, comorbidity of HTN, HLD ?Discussed form of medication, MOA, titration of dosing, side effects ?

## 2022-03-23 NOTE — Assessment & Plan Note (Signed)
Rx augmentin 875 mg bid x 10 days ?See ulcer note ?

## 2022-03-23 NOTE — Assessment & Plan Note (Addendum)
Advised to keep clean and dry, wash w/ soap and water ?Urgent ref to podiatry for potential debridement ?Ordered xray L foot to r/o osteomyelitis  ?Discussed likely will need vascular ref if swelling/decreased sensation is from injury during dvt procedure ?

## 2022-03-24 ENCOUNTER — Ambulatory Visit
Admission: RE | Admit: 2022-03-24 | Discharge: 2022-03-24 | Disposition: A | Discharge status: Home / Self Care | Payer: 59 | Attending: Physician Assistant | Admitting: Physician Assistant

## 2022-03-24 ENCOUNTER — Ambulatory Visit
Admission: RE | Admit: 2022-03-24 | Discharge: 2022-03-24 | Disposition: A | Discharge status: Home / Self Care | Payer: 59 | Source: Ambulatory Visit | Attending: Physician Assistant | Admitting: Physician Assistant

## 2022-03-24 DIAGNOSIS — L97529 Non-pressure chronic ulcer of other part of left foot with unspecified severity: Secondary | ICD-10-CM | POA: Insufficient documentation

## 2022-03-24 DIAGNOSIS — M19072 Primary osteoarthritis, left ankle and foot: Secondary | ICD-10-CM | POA: Diagnosis not present

## 2022-03-24 DIAGNOSIS — L03032 Cellulitis of left toe: Secondary | ICD-10-CM | POA: Insufficient documentation

## 2022-03-24 DIAGNOSIS — M7989 Other specified soft tissue disorders: Secondary | ICD-10-CM | POA: Diagnosis not present

## 2022-03-25 ENCOUNTER — Encounter: Payer: Self-pay | Admitting: Physician Assistant

## 2022-04-02 ENCOUNTER — Encounter: Payer: Self-pay | Admitting: Physician Assistant

## 2022-04-02 NOTE — Telephone Encounter (Signed)
Copied from [place]. Topic: General - Other ?>> Apr 01, 2022  4:31 PM Yvette Rack wrote: ?Reason for CRM: Pt would like to know what the plan is now that his insurance denied the Rx request. Pt requests call back ?

## 2022-04-06 ENCOUNTER — Encounter: Payer: Self-pay | Admitting: Physician Assistant

## 2022-04-06 DIAGNOSIS — M722 Plantar fascial fibromatosis: Secondary | ICD-10-CM | POA: Diagnosis not present

## 2022-04-06 DIAGNOSIS — R600 Localized edema: Secondary | ICD-10-CM | POA: Diagnosis not present

## 2022-04-06 DIAGNOSIS — L97522 Non-pressure chronic ulcer of other part of left foot with fat layer exposed: Secondary | ICD-10-CM | POA: Diagnosis not present

## 2022-04-12 ENCOUNTER — Other Ambulatory Visit: Payer: Self-pay | Admitting: Physician Assistant

## 2022-04-12 DIAGNOSIS — I872 Venous insufficiency (chronic) (peripheral): Secondary | ICD-10-CM

## 2022-04-12 DIAGNOSIS — L97529 Non-pressure chronic ulcer of other part of left foot with unspecified severity: Secondary | ICD-10-CM

## 2022-04-27 DIAGNOSIS — L97522 Non-pressure chronic ulcer of other part of left foot with fat layer exposed: Secondary | ICD-10-CM | POA: Diagnosis not present

## 2022-05-18 ENCOUNTER — Encounter: Payer: Self-pay | Admitting: Physician Assistant

## 2022-05-18 ENCOUNTER — Ambulatory Visit (INDEPENDENT_AMBULATORY_CARE_PROVIDER_SITE_OTHER): Payer: 59 | Admitting: Physician Assistant

## 2022-05-18 VITALS — BP 131/74 | HR 64 | Ht 74.0 in | Wt 290.9 lb

## 2022-05-18 DIAGNOSIS — Z6837 Body mass index (BMI) 37.0-37.9, adult: Secondary | ICD-10-CM

## 2022-05-18 DIAGNOSIS — E663 Overweight: Secondary | ICD-10-CM | POA: Diagnosis not present

## 2022-05-18 DIAGNOSIS — L97529 Non-pressure chronic ulcer of other part of left foot with unspecified severity: Secondary | ICD-10-CM | POA: Diagnosis not present

## 2022-05-18 DIAGNOSIS — R202 Paresthesia of skin: Secondary | ICD-10-CM | POA: Diagnosis not present

## 2022-05-18 DIAGNOSIS — I10 Essential (primary) hypertension: Secondary | ICD-10-CM | POA: Diagnosis not present

## 2022-05-18 DIAGNOSIS — R200 Anesthesia of skin: Secondary | ICD-10-CM | POA: Diagnosis not present

## 2022-05-18 NOTE — Assessment & Plan Note (Signed)
Continue w/ podiatry visits.  As discussed previously, likely vascular in nature, encouraged pt to keep upcoming appointment w/ vascular surgery

## 2022-05-18 NOTE — Assessment & Plan Note (Signed)
Still waiting on PA from [place]. At this point encouraged pt to contact insurance Discussed seeing nutritionist/dietician in the meantime, pt agrees.

## 2022-05-18 NOTE — Assessment & Plan Note (Addendum)
Etiology likely vascular in nature Will order a1c, cmp, vit b12.  Discussed trial of gabapentin, low dose as he takes trazodone nightly; for nerve pain. Will get cmp before rx. Encouraged pt to keep appointment w/ vascular surgery and continue seeing podiatry

## 2022-05-18 NOTE — Addendum Note (Signed)
Addended byMikey Kirschner on: 05/18/2022 09:14 AM   Modules accepted: Orders

## 2022-05-18 NOTE — Assessment & Plan Note (Signed)
Well controlled today Ordered cmp; last cmp from 2022 was reviewed in care everywhere

## 2022-05-18 NOTE — Progress Notes (Signed)
Established patient visit   Patient: Darrell Montes.   DOB: 29-Nov-1961   61 y.o. Male  MRN: [id number] Visit Date: 05/18/2022  Today's healthcare provider: Mikey Kirschner, PA-C   Cc. Weight management f/u  Subjective    HPI  Garion is a 61 y/o male who presents today for weight management. He states he has not yet received the [facility] medication.   Reports no improvement in the sharp burning pain in his left foot, despite his podiatrist saying the ulcer is improving. He sees the podiatrist regularly for wound debridement. He has an upcoming appointment w/ vascular.   Medications: Outpatient Medications Prior to Visit  Medication Sig   amLODipine (NORVASC) 5 MG tablet TAKE 1 TABLET BY MOUTH ONCE DAILY   amoxicillin-clavulanate (AUGMENTIN) 875-125 MG tablet Take 1 tablet by mouth 2 (two) times daily.   etodolac (LODINE) 500 MG tablet Take 500 mg by mouth 2 (two) times daily.   loratadine (CLARITIN) 10 MG tablet Take 1 tablet (10 mg total) by mouth daily.   montelukast (SINGULAIR) 10 MG tablet Take 1 tablet (10 mg total) by mouth at bedtime. Please schedule an office visit before anymore refills.   Multiple Vitamin tablet Take by mouth.   pantoprazole (PROTONIX) 40 MG tablet TAKE 1 TABLET BY MOUTH ONCE DAILY   Semaglutide-Weight Management (WEGOVY) 0.25 MG/0.5ML SOAJ Inject 0.25 mg into the skin once a week.   tamsulosin (FLOMAX) 0.4 MG CAPS capsule TAKE 1 CAPSULE BY MOUTH ONCE DAILY   traZODone (DESYREL) 100 MG tablet TAKE 1 TABLET BY MOUTH AT BEDTIME   No facility-administered medications prior to visit.    Review of Systems  Constitutional:  Negative for fatigue and fever.  Respiratory:  Negative for cough and shortness of breath.   Cardiovascular:  Negative for chest pain, palpitations and leg swelling.  Musculoskeletal:  Positive for arthralgias.  Neurological:  Negative for dizziness and headaches.       Objective    BP 131/74 (BP Location: Left Arm, Patient  Position: Sitting, Cuff Size: Large)   Pulse 64   Ht '6\' 2"'$  (1.88 m)   Wt 290 lb 14.4 oz (132 kg)   SpO2 97%   BMI 37.35 kg/m    Physical Exam Constitutional:      General: He is awake.     Appearance: He is well-developed.  HENT:     Head: Normocephalic.  Eyes:     Conjunctiva/sclera: Conjunctivae normal.  Cardiovascular:     Rate and Rhythm: Normal rate and regular rhythm.     Heart sounds: Normal heart sounds.  Pulmonary:     Effort: Pulmonary effort is normal.     Breath sounds: Normal breath sounds.  Musculoskeletal:     Right lower leg: No edema.     Left lower leg: Edema present.  Skin:    General: Skin is warm.  Neurological:     Mental Status: He is alert and oriented to person, place, and time.  Psychiatric:        Attention and Perception: Attention normal.        Mood and Affect: Mood normal.        Speech: Speech normal.        Behavior: Behavior is cooperative.    No results found for any visits on 05/18/22.  Assessment & Plan     Problem List Items Addressed This Visit       Cardiovascular and Mediastinum   Essential (primary) hypertension -  Primary    Well controlled today Ordered cmp; last cmp from 2022 was reviewed in care everywhere      Relevant Orders   CBC w/Diff/Platelet   Comprehensive Metabolic Panel (CMET)     Other   BMI 37.0-37.9, adult    Still waiting on PA from [place]. At this point encouraged pt to contact insurance Discussed seeing nutritionist/dietician in the meantime, pt agrees.      Relevant Orders   Amb Ref to Medical Weight Management   Skin ulcer of left great toe ([place])    Continue w/ podiatry visits.  As discussed previously, likely vascular in nature, encouraged pt to keep upcoming appointment w/ vascular surgery      Relevant Orders   HgB A1c   Numbness and tingling of foot    Etiology likely vascular in nature Will order a1c, cmp, vit b12.  Discussed trial of gabapentin, low dose as he takes trazodone  nightly; for nerve pain. Will get cmp before rx. Encouraged pt to keep appointment w/ vascular surgery and continue seeing podiatry      Relevant Orders   Vitamin B12    Return in about 3 months (around 08/18/2022) for chronic conditions.      I, Mikey Kirschner, PA-C have reviewed all documentation for this visit. The documentation on  05/18/2022 for the exam, diagnosis, procedures, and orders are all accurate and complete.  Mikey Kirschner, PA-C [facility] [address] [place], [place], [address] Office: [phone] Fax: [place]

## 2022-05-19 LAB — COMPREHENSIVE METABOLIC PANEL
ALT: 36 IU/L (ref 0–44)
AST: 30 IU/L (ref 0–40)
Albumin/Globulin Ratio: 2.2 (ref 1.2–2.2)
Albumin: 4.6 g/dL (ref 3.8–4.9)
Alkaline Phosphatase: 90 IU/L (ref 44–121)
BUN/Creatinine Ratio: 19 (ref 10–24)
BUN: 14 mg/dL (ref 8–27)
Bilirubin Total: 0.4 mg/dL (ref 0.0–1.2)
CO2: 26 mmol/L (ref 20–29)
Calcium: 9.6 mg/dL (ref 8.6–10.2)
Chloride: 101 mmol/L (ref 96–106)
Creatinine, Ser: 0.73 mg/dL — ABNORMAL LOW (ref 0.76–1.27)
Globulin, Total: 2.1 g/dL (ref 1.5–4.5)
Glucose: 116 mg/dL — ABNORMAL HIGH (ref 70–99)
Potassium: 4.2 mmol/L (ref 3.5–5.2)
Sodium: 144 mmol/L (ref 134–144)
Total Protein: 6.7 g/dL (ref 6.0–8.5)
eGFR: 104 mL/min/{1.73_m2} (ref >59)

## 2022-05-19 LAB — CBC WITH DIFFERENTIAL/PLATELET
Basophils Absolute: 0 10*3/uL (ref 0.0–0.2)
Basos: 0 %
EOS (ABSOLUTE): 0.3 10*3/uL (ref 0.0–0.4)
Eos: 4 %
Hematocrit: 41.4 % (ref 37.5–51.0)
Hemoglobin: 13.6 g/dL (ref 13.0–17.7)
Immature Grans (Abs): 0 10*3/uL (ref 0.0–0.1)
Immature Granulocytes: 1 %
Lymphocytes Absolute: 1 10*3/uL (ref 0.7–3.1)
Lymphs: 15 %
MCH: 30 pg (ref 26.6–33.0)
MCHC: 32.9 g/dL (ref 31.5–35.7)
MCV: 91 fL (ref 79–97)
Monocytes Absolute: 0.5 10*3/uL (ref 0.1–0.9)
Monocytes: 7 %
Neutrophils Absolute: 5 10*3/uL (ref 1.4–7.0)
Neutrophils: 73 %
Platelets: 135 10*3/uL — ABNORMAL LOW (ref 150–450)
RBC: 4.53 x10E6/uL (ref 4.14–5.80)
RDW: 13.7 % (ref 11.6–15.4)
WBC: 6.8 10*3/uL (ref 3.4–10.8)

## 2022-05-19 LAB — HEMOGLOBIN A1C
Est. average glucose Bld gHb Est-mCnc: 117 mg/dL
Hgb A1c MFr Bld: 5.7 % — ABNORMAL HIGH (ref 4.8–5.6)

## 2022-05-19 LAB — VITAMIN B12: Vitamin B-12: 353 pg/mL (ref 232–1245)

## 2022-05-27 DIAGNOSIS — R202 Paresthesia of skin: Secondary | ICD-10-CM | POA: Diagnosis not present

## 2022-05-27 DIAGNOSIS — R7303 Prediabetes: Secondary | ICD-10-CM | POA: Diagnosis not present

## 2022-05-27 DIAGNOSIS — I10 Essential (primary) hypertension: Secondary | ICD-10-CM | POA: Diagnosis not present

## 2022-05-27 DIAGNOSIS — G4733 Obstructive sleep apnea (adult) (pediatric): Secondary | ICD-10-CM | POA: Diagnosis not present

## 2022-05-27 DIAGNOSIS — R600 Localized edema: Secondary | ICD-10-CM | POA: Diagnosis not present

## 2022-05-28 DIAGNOSIS — L97522 Non-pressure chronic ulcer of other part of left foot with fat layer exposed: Secondary | ICD-10-CM | POA: Diagnosis not present

## 2022-06-02 DIAGNOSIS — R202 Paresthesia of skin: Secondary | ICD-10-CM | POA: Diagnosis not present

## 2022-06-04 ENCOUNTER — Other Ambulatory Visit (INDEPENDENT_AMBULATORY_CARE_PROVIDER_SITE_OTHER): Payer: Self-pay | Admitting: Nurse Practitioner

## 2022-06-04 DIAGNOSIS — L97529 Non-pressure chronic ulcer of other part of left foot with unspecified severity: Secondary | ICD-10-CM

## 2022-06-07 ENCOUNTER — Encounter (INDEPENDENT_AMBULATORY_CARE_PROVIDER_SITE_OTHER): Payer: Self-pay | Admitting: Vascular Surgery

## 2022-06-07 ENCOUNTER — Ambulatory Visit (INDEPENDENT_AMBULATORY_CARE_PROVIDER_SITE_OTHER): Payer: 59 | Admitting: Vascular Surgery

## 2022-06-07 ENCOUNTER — Ambulatory Visit (INDEPENDENT_AMBULATORY_CARE_PROVIDER_SITE_OTHER): Payer: 59

## 2022-06-07 VITALS — BP 147/71 | HR 84 | Resp 17 | Ht 74.0 in | Wt 292.0 lb

## 2022-06-07 DIAGNOSIS — I10 Essential (primary) hypertension: Secondary | ICD-10-CM

## 2022-06-07 DIAGNOSIS — L97529 Non-pressure chronic ulcer of other part of left foot with unspecified severity: Secondary | ICD-10-CM | POA: Diagnosis not present

## 2022-06-07 DIAGNOSIS — E785 Hyperlipidemia, unspecified: Secondary | ICD-10-CM

## 2022-06-07 DIAGNOSIS — I872 Venous insufficiency (chronic) (peripheral): Secondary | ICD-10-CM

## 2022-06-07 DIAGNOSIS — M5410 Radiculopathy, site unspecified: Secondary | ICD-10-CM

## 2022-06-07 DIAGNOSIS — K219 Gastro-esophageal reflux disease without esophagitis: Secondary | ICD-10-CM

## 2022-06-07 NOTE — Progress Notes (Signed)
MRN : [id number]  Darrell Montes. is a 61 y.o. (1960-12-05) male who presents with chief complaint of check circulation.  History of Present Illness:   Patient is seen for evaluation of leg pain and leg swelling. The patient first noticed the swelling remotely. The swelling is associated with pain and discoloration. The pain and swelling worsens with prolonged dependency and improves with elevation. The pain is unrelated to activity.  The patient notes that in the morning the legs are significantly improved but they steadily worsened throughout the course of the day. The patient also notes a steady worsening of the discoloration in the ankle and shin area.   The patient denies claudication symptoms.  The patient denies symptoms consistent with rest pain.  The patient denies and extensive history of DJD and LS spine disease.  The patient has no had any past angiography, interventions or vascular surgery.  Elevation makes the leg symptoms better, dependency makes them much worse. There is no history of ulcerations. The patient denies any recent changes in medications.  The patient has not been wearing graduated compression.  The patient denies a history of DVT or PE. There is no prior history of phlebitis. There is no history of primary lymphedema.  No history of malignancies. No history of trauma or groin or pelvic surgery. There is no history of radiation treatment to the groin or pelvis  The patient denies amaurosis fugax or recent TIA symptoms. There are no recent neurological changes noted. The patient denies recent episodes of angina or shortness of breath   ABI's including toe tracings are normal bilaterally  No outpatient medications have been marked as taking for the 06/07/22 encounter (Appointment) with Delana Meyer, Dolores Lory, MD.    Past Medical History:  Diagnosis Date   Hx of blood clots    Hypertension     Past Surgical History:  Procedure Laterality  Date   BACK SURGERY     X2 lower disc    KNEE SURGERY Left    X2 scope   UPPER GI ENDOSCOPY  07/11/2013   normal duodenum, benign appearing esophageal stricture x2     Social History Social History   Tobacco Use   Smoking status: Never   Smokeless tobacco: Never  Vaping Use   Vaping Use: Never used  Substance Use Topics   Alcohol use: Yes    Alcohol/week: 3.0 standard drinks of alcohol    Types: 3 Standard drinks or equivalent per week    Comment: socially   Drug use: No    Family History Family History  Problem Relation Age of Onset   COPD Mother    Bladder Cancer Mother    Hypertension Father    COPD Father    Sleep apnea Father     No Known Allergies   REVIEW OF SYSTEMS (Negative unless checked)  Constitutional: '[]'$ Weight loss  '[]'$ Fever  '[]'$ Chills Cardiac: '[]'$ Chest pain   '[]'$ Chest pressure   '[]'$ Palpitations   '[]'$ Shortness of breath when laying flat   '[]'$ Shortness of breath with exertion. Vascular:  '[x]'$ Pain in legs with walking   '[]'$ Pain in legs at rest  '[]'$ History of DVT   '[]'$ Phlebitis   '[]'$ Swelling in legs   '[]'$ Varicose veins   '[]'$ Non-healing ulcers Pulmonary:   '[]'$ Uses home oxygen   '[]'$ Productive cough   '[]'$ Hemoptysis   '[]'$ Wheeze  '[]'$ COPD   '[]'$ Asthma Neurologic:  '[]'$ Dizziness   '[]'$ Seizures   '[]'$ History  of stroke   '[]'$ History of TIA  '[]'$ Aphasia   '[]'$ Vissual changes   '[]'$ Weakness or numbness in arm   '[]'$ Weakness or numbness in leg Musculoskeletal:   '[]'$ Joint swelling   '[]'$ Joint pain   '[]'$ Low back pain Hematologic:  '[]'$ Easy bruising  '[]'$ Easy bleeding   '[]'$ Hypercoagulable state   '[]'$ Anemic Gastrointestinal:  '[]'$ Diarrhea   '[]'$ Vomiting  '[x]'$ Gastroesophageal reflux/heartburn   '[]'$ Difficulty swallowing. Genitourinary:  '[]'$ Chronic kidney disease   '[]'$ Difficult urination  '[]'$ Frequent urination   '[]'$ Blood in urine Skin:  '[]'$ Rashes   '[]'$ Ulcers  Psychological:  '[]'$ History of anxiety   '[]'$  History of major depression.  Physical Examination  There were no vitals filed for this visit. There is no height or weight on  file to calculate BMI. Gen: WD/WN, NAD Head: [place]/AT, No temporalis wasting.  Ear/Nose/Throat: Hearing grossly intact, nares w/o erythema or drainage Eyes: PER, EOMI, sclera nonicteric.  Neck: Supple, no masses.  No bruit or JVD.  Pulmonary:  Good air movement, no audible wheezing, no use of accessory muscles.  Cardiac: RRR, normal S1, S2, no Murmurs. Vascular:  scattered varicosities present bilaterally.  Moderate to severe venous stasis changes to the legs bilaterally.  3+ soft pitting edema callous of the great toe Vessel Right Left  Radial Palpable Palpable  PT Not Palpable Not Palpable  DP Not Palpable Not Palpable  Gastrointestinal: soft, non-distended. No guarding/no peritoneal signs.  Musculoskeletal: M/S 5/5 throughout.  No visible deformity.  Neurologic: CN 2-12 intact. Pain and light touch intact in extremities.  Symmetrical.  Speech is fluent. Motor exam as listed above. Psychiatric: Judgment intact, Mood & affect appropriate for pt's clinical situation. Dermatologic: No rashes or ulcers noted.  No changes consistent with cellulitis.   CBC Lab Results  Component Value Date   WBC 6.8 05/18/2022   HGB 13.6 05/18/2022   HCT 41.4 05/18/2022   MCV 91 05/18/2022   PLT 135 (L) 05/18/2022    BMET    Component Value Date/Time   NA 144 05/18/2022 0853   K 4.2 05/18/2022 0853   CL 101 05/18/2022 0853   CO2 26 05/18/2022 0853   GLUCOSE 116 (H) 05/18/2022 0853   BUN 14 05/18/2022 0853   CREATININE 0.73 (L) 05/18/2022 0853   CALCIUM 9.6 05/18/2022 0853   GFRNONAA 102 03/19/2020 0818   GFRAA 118 03/19/2020 0818   CrCl cannot be calculated (Unknown ideal weight.).  COAG No results found for: "INR", "PROTIME"  Radiology No results found.   Assessment/Plan 1. Venous insufficiency (chronic) (peripheral) Recommend:  I have had a long discussion with the patient regarding swelling and why it  causes symptoms.  Patient will begin wearing graduated compression on a daily  basis a prescription was given. The patient will  wear the stockings first thing in the morning and removing them in the evening. The patient is instructed specifically not to sleep in the stockings.   In addition, behavioral modification will be initiated.  This will include frequent elevation, use of over the counter pain medications and exercise such as walking.  Consideration for a lymph pump will also be made based upon the effectiveness of conservative therapy.  This would help to improve the edema control and prevent sequela such as ulcers and infections   Patient should undergo duplex ultrasound of the venous system to ensure that DVT or reflux is not present.  The patient will follow-up with me after the ultrasound.   - VAS [place] LOWER EXTREMITY VENOUS REFLUX; Future  2. Essential (primary) hypertension Continue  antihypertensive medications as already ordered, these medications have been reviewed and there are no changes at this time.   3. Gastroesophageal reflux disease, unspecified whether esophagitis present Continue PPI as already ordered, this medication has been reviewed and there are no changes at this time.  Avoidence of caffeine and alcohol  Moderate elevation of the head of the bed    4. Hyperlipidemia, unspecified hyperlipidemia type Continue statin as ordered and reviewed, no changes at this time     Hortencia Pilar, MD  06/07/2022 6:51 AM

## 2022-06-08 ENCOUNTER — Telehealth: Payer: Self-pay | Admitting: Physician Assistant

## 2022-06-08 DIAGNOSIS — N400 Enlarged prostate without lower urinary tract symptoms: Secondary | ICD-10-CM

## 2022-06-08 MED ORDER — TAMSULOSIN HCL 0.4 MG PO CAPS: 0.4000 mg | ORAL_CAPSULE | Freq: Every day | ORAL | 1 refills | Status: DC

## 2022-06-08 NOTE — Telephone Encounter (Signed)
[facility] faxed refill request for the following medications:  tamsulosin (FLOMAX) 0.4 MG CAPS capsule   Please advise.

## 2022-06-11 ENCOUNTER — Encounter (INDEPENDENT_AMBULATORY_CARE_PROVIDER_SITE_OTHER): Payer: Self-pay | Admitting: Vascular Surgery

## 2022-06-11 DIAGNOSIS — L97909 Non-pressure chronic ulcer of unspecified part of unspecified lower leg with unspecified severity: Secondary | ICD-10-CM | POA: Insufficient documentation

## 2022-06-17 ENCOUNTER — Telehealth: Payer: Self-pay

## 2022-06-17 NOTE — Telephone Encounter (Signed)
Copied from [place] [phone]. Topic: General - Call Back - No Documentation >> Jun 17, 2022  3:05 PM Oley Balm E wrote: Reason for CRM: Pt needs results from his sleep studies. Please advise, he needs a bite guard and they need those results Best contact: [phone]

## 2022-06-17 NOTE — Telephone Encounter (Signed)
Copied from [place] [id number]. Topic: Medical Record Request - Provider/Facility Request >> Jun 17, 2022  3:16 PM Sabas Sous wrote: Reason for CRM: Alyssa from [facility] called to request records of his sleep studies. Please advise, they need this for a device theyre preparing for him  Fax: [phone] Best contact: [phone]  Please advise for a ETA, pt is scheduled tomorrow.

## 2022-06-18 NOTE — Telephone Encounter (Signed)
Sleep study faxed 

## 2022-06-20 ENCOUNTER — Encounter: Payer: Self-pay | Admitting: Physician Assistant

## 2022-06-21 ENCOUNTER — Other Ambulatory Visit: Payer: Self-pay | Admitting: Physician Assistant

## 2022-06-21 DIAGNOSIS — I10 Essential (primary) hypertension: Secondary | ICD-10-CM

## 2022-06-21 MED ORDER — AMLODIPINE BESYLATE 5 MG PO TABS: 5.0000 mg | ORAL_TABLET | Freq: Every day | ORAL | 1 refills | Status: DC

## 2022-06-22 ENCOUNTER — Other Ambulatory Visit: Payer: Self-pay | Admitting: Physician Assistant

## 2022-06-22 DIAGNOSIS — E785 Hyperlipidemia, unspecified: Secondary | ICD-10-CM

## 2022-06-22 MED ORDER — ATORVASTATIN CALCIUM 10 MG PO TABS: 10.0000 mg | ORAL_TABLET | Freq: Every day | ORAL | 1 refills | Status: DC

## 2022-07-01 ENCOUNTER — Telehealth: Payer: Self-pay

## 2022-07-01 DIAGNOSIS — M25562 Pain in left knee: Secondary | ICD-10-CM | POA: Diagnosis not present

## 2022-07-01 DIAGNOSIS — E669 Obesity, unspecified: Secondary | ICD-10-CM | POA: Diagnosis not present

## 2022-07-01 DIAGNOSIS — G8929 Other chronic pain: Secondary | ICD-10-CM | POA: Diagnosis not present

## 2022-07-01 DIAGNOSIS — M1712 Unilateral primary osteoarthritis, left knee: Secondary | ICD-10-CM | POA: Diagnosis not present

## 2022-07-01 NOTE — Telephone Encounter (Signed)
-----   Message from Mikey Kirschner, PA-C sent at 07/01/2022 10:31 AM EDT ----- Can we give him a call to see if he needs a new machine/ still using his? ----- Message ----- From: Aleatha Borer Sent: 07/01/2022   8:40 AM EDT To: Mikey Kirschner, PA-C

## 2022-07-01 NOTE — Telephone Encounter (Signed)
Patient states he never used one and does not need a new one. Please assist patient further if necessary

## 2022-07-07 ENCOUNTER — Encounter: Payer: 59 | Attending: Physician Assistant | Admitting: Dietician

## 2022-07-07 ENCOUNTER — Encounter: Payer: Self-pay | Admitting: Dietician

## 2022-07-07 VITALS — Ht 74.0 in | Wt 283.0 lb

## 2022-07-07 DIAGNOSIS — E785 Hyperlipidemia, unspecified: Secondary | ICD-10-CM

## 2022-07-07 DIAGNOSIS — I10 Essential (primary) hypertension: Secondary | ICD-10-CM | POA: Diagnosis not present

## 2022-07-07 DIAGNOSIS — E669 Obesity, unspecified: Secondary | ICD-10-CM

## 2022-07-07 DIAGNOSIS — Z713 Dietary counseling and surveillance: Secondary | ICD-10-CM | POA: Insufficient documentation

## 2022-07-07 DIAGNOSIS — Z6825 Body mass index (BMI) 25.0-25.9, adult: Secondary | ICD-10-CM | POA: Insufficient documentation

## 2022-07-07 DIAGNOSIS — E663 Overweight: Secondary | ICD-10-CM | POA: Diagnosis not present

## 2022-07-07 NOTE — Patient Instructions (Signed)
Try [place] or vegetarian sausage for breakfast, or just eggs and toast.  Measure some food portions to make sure they match recommended amounts.  Continue to emphasize lean protein foods and plenty of vegetables and fruits, eat generous portions of the low carb veggies and limit any added fats like butter and salad dressing Keep up regular exercise, great job!

## 2022-07-07 NOTE — Progress Notes (Signed)
Medical Nutrition Therapy: Visit start time: 0830  end time: 0930  Assessment:   Referral Diagnosis: hypertension, obesity Other medical history/ diagnoses: deep vein thrombosis; history of back surgery Psychosocial issues/ stress concerns: high stress level due to dealing with parents' estate and marital separation  Medications, supplements: reconciled list in medical record   Preferred learning method:  Auditory Visual   Current weight: 283lbs Height: 6'2" BMI: 36.34 Patient's personal weight goal: 235lbs  Progress and evaluation:  Labs done 05/27/22 indicate HbA1C 5.7%, BG 116. Patient is aware of pre-diabetes state; 1 aunt has type 2 diabetes, but no other know family history of diabetes He reports about 40lb weight gain since deaths of both parents 2 years ago, as well as marital separation.  He has been experiencing leg edema, mostly in left leg, which increases towards the end of the workday. Does not add salt to any foods Food allergies: none known Special diet practices: none Patient seeks help with weight loss to help with leg edema and pain and improve BP and other health risks Next PCP appt is 06/2022   Dietary Intake:  Usual eating pattern includes 3 meals and 0-2 snacks per day. Dining out frequency: 3-4 meals per week. Who plans meals/ buys groceries? self Who prepares meals? self  Breakfast: 2 eggs, 1pc sausage, 1 pc toast or 1 tortilla, coffee/ cereal or oatmeal occasionally Snack: none Lunch: salad; leftovers Snack: none; occ peanuts Supper: chicken nuggets/ wings; pizza; pasta Snack: occasionally popcorn, snack bag Beverages: 2c coffee in am; water with sugar free flavoring, metamucil in am; rarely diet coke  Physical activity: on the job walking, stationary bike 20-30 minutes, weights 30-40 minutes 3-4 times a week   Intervention:   Nutrition Care Education:   Basic nutrition: basic food groups; appropriate nutrient balance; appropriate meal and snack  schedule; general nutrition guidelines    Weight control: determining reasonable weight loss rate; importance of low sugar and low fat choices; portion control; estimated energy needs for weight loss at 1700-1900 kcal, provided guidance for 45% CHO, 25% pro, 30% fat; discussed weight loss medications per patient question;  Advanced nutrition: dining out guidelines Pre-Diabetes:  appropriate meal and snack schedule; appropriate carb intake and balance Hypertension:  importance of controlling BP; identifying high sodium foods, identifying food sources of potassium, magnesium  Other intervention notes: Patient voices readiness to make dietary changes to promote weight loss. He is interested in taking weight loss medication; discussed options Established goals for change with direction from patient. He did not wish to schedule MNT follow up at this time but will schedule later if needed.   Nutritional Diagnosis:  [place]-2.1 Inpaired nutrition utilization and [place]-2.2 Altered nutrition-related laboratory As related to hypertension, hyperlipidemia, pre-daibetes.  As evidenced by patient with elevated HbA1C, BP and blood lipidse controlled with medications.. [place]-3.3 Overweight/obesity As related to excess calories, history o f inadequate physical activity.  As evidenced by patient with current BMI of 36.34.   Education Materials given:  Museum/gallery conservator with food lists, sample meal pattern Sample menus Visit summary with goals/ instructions   Learner/ who was taught:  Patient   Level of understanding: Verbalizes/ demonstrates competency   Demonstrated degree of understanding via:   Teach back Learning barriers: None  Willingness to learn/ readiness for change: Acceptance, ready for change   Monitoring and Evaluation:  Dietary intake, exercise, BP control, BG control, and body weight      follow up: prn

## 2022-07-13 DIAGNOSIS — R202 Paresthesia of skin: Secondary | ICD-10-CM | POA: Diagnosis not present

## 2022-07-13 DIAGNOSIS — R200 Anesthesia of skin: Secondary | ICD-10-CM | POA: Diagnosis not present

## 2022-07-15 ENCOUNTER — Ambulatory Visit: Payer: 59 | Admitting: Neurosurgery

## 2022-07-15 VITALS — BP 142/78 | Ht 74.0 in | Wt 293.0 lb

## 2022-07-15 DIAGNOSIS — R202 Paresthesia of skin: Secondary | ICD-10-CM | POA: Diagnosis not present

## 2022-07-15 DIAGNOSIS — R200 Anesthesia of skin: Secondary | ICD-10-CM

## 2022-07-15 NOTE — Progress Notes (Signed)
Referring Physician:  Katha Cabal, MD [address] [place],  [place] [address]  Primary Physician:  Darrell Kirschner, PA-C  History of Present Illness: 07/15/2022 [name] is a 61 y.o with a history of HTN, HLD, venous insufficiency, GERD who is here today as a referral from vascular for pain in his bilateral calf with associated numbness into the bottom of his feet. He states that this started in February of this year and has improved some since the onset.  He also endorses swelling in his legs worse on the left than the right which has been present since his previous surgeries.  He states that he has numbness in the bottom of his feet and painful tingling largely from his mid calf distally circumferentially.  He does seem to be worse to some degree with ambulating and at night.  He denies any back pain or pain that radiates down the length of his legs similar to his preoperative pain. He is currently under the care of Dr. Brigitte Montes and had an EMG yesterday.  Conservative measures:  Physical therapy: none  Multimodal medical therapy including regular antiinflammatories: Gabapentin 300 mg Injections: no epidural steroid injections  Past Surgery: 2 prior back surgeries 8-10 years ago.   Tramel L Sherri Sear. has no symptoms of cervical myelopathy.  The symptoms are causing a significant impact on the patient's life.   Review of Systems:  A 10 point review of systems is negative, except for the pertinent positives and negatives detailed in the HPI.  Past Medical History: Past Medical History:  Diagnosis Date   Hx of blood clots    Hypertension     Past Surgical History: Past Surgical History:  Procedure Laterality Date   BACK SURGERY     X2 lower disc    KNEE SURGERY Left    X2 scope   UPPER GI ENDOSCOPY  07/11/2013   normal duodenum, benign appearing esophageal stricture x2     Allergies: Allergies as of 07/15/2022   (No Known Allergies)    Medications: Outpatient  Encounter Medications as of 07/15/2022  Medication Sig   amLODipine (NORVASC) 5 MG tablet Take 1 tablet (5 mg total) by mouth daily.   atorvastatin (LIPITOR) 10 MG tablet Take 1 tablet (10 mg total) by mouth daily.   etodolac (LODINE) 500 MG tablet Take 500 mg by mouth 2 (two) times daily.   gabapentin (NEURONTIN) 300 MG capsule Take by mouth.   loratadine (CLARITIN) 10 MG tablet Take 1 tablet (10 mg total) by mouth daily. (Patient taking differently: Take 10 mg by mouth daily. Takes as needed)   losartan (COZAAR) 50 MG tablet Take 1 tablet by mouth daily.   Multiple Vitamin tablet Take by mouth.   pantoprazole (PROTONIX) 40 MG tablet TAKE 1 TABLET BY MOUTH ONCE DAILY   psyllium (METAMUCIL) 58.6 % powder Take 1 packet by mouth 3 (three) times daily.   tamsulosin (FLOMAX) 0.4 MG CAPS capsule Take 1 capsule (0.4 mg total) by mouth daily.   Vitamin D, Ergocalciferol, (DRISDOL) 1.25 MG (50000 UNIT) CAPS capsule Take 50,000 Units by mouth every 7 (seven) days.   No facility-administered encounter medications on file as of 07/15/2022.    Social History: Social History   Tobacco Use   Smoking status: Never   Smokeless tobacco: Never  Vaping Use   Vaping Use: Never used  Substance Use Topics   Alcohol use: Yes    Alcohol/week: 8.0 standard drinks of alcohol    Types:  8 Standard drinks or equivalent per week    Comment: 0-2 daily   Drug use: No    Family Medical History: Family History  Problem Relation Age of Onset   COPD Mother    Bladder Cancer Mother    Hypertension Father    COPD Father    Sleep apnea Father     Physical Examination:  Today's Vitals   07/15/22 1122  BP: (!) 142/78  Weight: 132.9 kg  Height: '6\' 2"'$  (1.88 m)  PainSc: 1   PainLoc: Leg   Body mass index is 37.62 kg/m.   General: Patient is well developed, well nourished, calm, collected, and in no apparent distress. Attention to examination is appropriate.  Psychiatric: Patient is  non-anxious.  Head:  Pupils equal, round, and reactive to light.  ENT:  Oral mucosa appears well hydrated.  Neck:   Supple.   Respiratory: Patient is breathing without any difficulty.  Extremities: No edema.  Vascular: Palpable dorsal pedal pulses.  Skin:   On exposed skin, there are no abnormal skin lesions.  NEUROLOGICAL:     Awake, alert, oriented to person, place, and time.  Speech is clear and fluent. Fund of knowledge is appropriate.   Cranial Nerves: Pupils equal round and reactive to light.  Facial tone is symmetric.  Facial sensation is symmetric.  ROM of spine: full.  Palpation of spine: non tender.    Strength: Side Biceps Triceps Deltoid Interossei Grip Wrist Ext. Wrist Flex.  R '5 5 5 5 5 5 5  '$ L '5 5 5 5 5 5 5   '$ Side Iliopsoas Quads Hamstring PF DF EHL  R '5 5 5 5 5 5  '$ L '5 5 5 5 5 5   '$ Reflexes are 2+ and symmetric at the biceps, triceps, brachioradialis, patella and achilles.   Hoffman's is absent.  Clonus is not present.  Toes are down-going.  Bilateral upper and lower extremity sensation is intact to light touch.    Gait is normal.     Medical Decision Making  Imaging: No relevant spine imaging to review.  Assessment and Plan: Mr. [name] is a pleasant 62 y.o. male with history of lumbosacral disease with 2 successful surgical interventions about a decade ago.  He has had about 6 months of numbness in the bottom of his feet and pain in his calves as well as chronic swelling which is worsened since February of last year.  He was seen by vascular and diagnosed with vascular insufficiency and recommended that he wear compression socks and referred to neurosurgery for further evaluation.  He does not have any significant back pain or radiating leg pain.  All symptoms may be indicative of neurogenic claudication is not incredibly convincing.  We discussed potentially moving forward with an MRI however he would like to see the results of his EMG before pursuing this.   He will let us know should his EMG results be largely negative and we will move forward with an MRI of his lumbar spine for further evaluation of lumbar stenosis.  He was encouraged to call our office with any questions or concerns.  He expressed understanding was in agreement with this plan.  Thank you for involving me in the care of this patient.   I spent a total of 30 minutes in both face-to-face and non-face-to-face activities for this visit on the date of this encounter review of outside records, review of symptoms, review of physical exam, discussion of differential diagnosis, and documentation.  [facility]

## 2022-08-17 NOTE — Progress Notes (Unsigned)
   I,Sha'taria Tyson,acting as a scribe for Lindsay Drubel, PA-C.,have documented all relevant documentation on the behalf of Lindsay Drubel, PA-C,as directed by  Lindsay Drubel, PA-C while in the presence of Lindsay Drubel, PA-C.   Established patient visit   Patient: Darrell L Prestridge Jr.   DOB: 03/25/1961   61 y.o. Male  MRN: [id number] Visit Date: 08/18/2022  Today's healthcare provider: Lindsay Drubel, PA-C   No chief complaint on file.  Subjective    HPI  Hypertension, follow-up  BP Readings from Last 3 Encounters:  07/15/22 (!) 142/78  06/07/22 (!) 147/71  05/18/22 131/74   Wt Readings from Last 3 Encounters:  07/15/22 293 lb (132.9 kg)  07/07/22 283 lb (128.4 kg)  06/07/22 292 lb (132.5 kg)     He was last seen for hypertension 3 months ago.  BP at that visit was 131/74. Management since that visit includes ***.  He reports {excellent/good/fair/poor:19665} compliance with treatment. He {is/is not:9024} having side effects. {document side effects if present:1} He is following a {diet:21022986} diet. He {is/is not:9024} exercising. He {does/does not:200015} smoke.  Use of agents associated with hypertension: {bp agents assoc with hypertension:511::"none"}.   Outside blood pressures are {***enter patient reported home BP readings, or 'not being checked':1}. Symptoms: {Yes/No:20286} chest pain {Yes/No:20286} chest pressure  {Yes/No:20286} palpitations {Yes/No:20286} syncope  {Yes/No:20286} dyspnea {Yes/No:20286} orthopnea  {Yes/No:20286} paroxysmal nocturnal dyspnea {Yes/No:20286} lower extremity edema   Pertinent labs Lab Results  Component Value Date   CHOL 167 03/19/2020   HDL 36 (L) 03/19/2020   LDLCALC 99 03/19/2020   TRIG 184 (H) 03/19/2020   CHOLHDL 4.5 07/17/2019   Lab Results  Component Value Date   NA 144 05/18/2022   K 4.2 05/18/2022   CREATININE 0.73 (L) 05/18/2022   EGFR 104 05/18/2022   GLUCOSE 116 (H) 05/18/2022   TSH 2.440 07/17/2019      The ASCVD Risk score (Arnett DK, et al., 2019) failed to calculate for the following reasons:   Unable to determine if patient is Non-Hispanic African American  ---------------------------------------------------------------------------------------------------   Medications: Outpatient Medications Prior to Visit  Medication Sig   amLODipine (NORVASC) 5 MG tablet Take 1 tablet (5 mg total) by mouth daily.   atorvastatin (LIPITOR) 10 MG tablet Take 1 tablet (10 mg total) by mouth daily.   etodolac (LODINE) 500 MG tablet Take 500 mg by mouth 2 (two) times daily.   gabapentin (NEURONTIN) 300 MG capsule Take by mouth.   loratadine (CLARITIN) 10 MG tablet Take 1 tablet (10 mg total) by mouth daily. (Patient taking differently: Take 10 mg by mouth daily. Takes as needed)   losartan (COZAAR) 50 MG tablet Take 1 tablet by mouth daily.   Multiple Vitamin tablet Take by mouth.   pantoprazole (PROTONIX) 40 MG tablet TAKE 1 TABLET BY MOUTH ONCE DAILY   psyllium (METAMUCIL) 58.6 % powder Take 1 packet by mouth 3 (three) times daily.   tamsulosin (FLOMAX) 0.4 MG CAPS capsule Take 1 capsule (0.4 mg total) by mouth daily.   Vitamin D, Ergocalciferol, (DRISDOL) 1.25 MG (50000 UNIT) CAPS capsule Take 50,000 Units by mouth every 7 (seven) days.   No facility-administered medications prior to visit.    Review of Systems  {Labs  Heme  Chem  Endocrine  Serology  Results Review (optional):23779}   Objective    There were no vitals taken for this visit. {Show previous vital signs (optional):23777}  Physical Exam  ***  No results found for any visits on   08/18/22.  Assessment & Plan     ***  No follow-ups on file.      {provider attestation***:1}   Mikey Kirschner, PA-C  [facility] [phone] (phone) [phone] (fax)  [place]

## 2022-08-18 ENCOUNTER — Encounter: Payer: Self-pay | Admitting: Physician Assistant

## 2022-08-18 ENCOUNTER — Ambulatory Visit (INDEPENDENT_AMBULATORY_CARE_PROVIDER_SITE_OTHER): Payer: 59 | Admitting: Physician Assistant

## 2022-08-18 VITALS — BP 129/79 | HR 66 | Ht 74.0 in | Wt 284.8 lb

## 2022-08-18 DIAGNOSIS — E785 Hyperlipidemia, unspecified: Secondary | ICD-10-CM | POA: Diagnosis not present

## 2022-08-18 DIAGNOSIS — Z23 Encounter for immunization: Secondary | ICD-10-CM

## 2022-08-18 DIAGNOSIS — Z125 Encounter for screening for malignant neoplasm of prostate: Secondary | ICD-10-CM

## 2022-08-18 DIAGNOSIS — D696 Thrombocytopenia, unspecified: Secondary | ICD-10-CM

## 2022-08-18 DIAGNOSIS — I10 Essential (primary) hypertension: Secondary | ICD-10-CM | POA: Diagnosis not present

## 2022-08-18 NOTE — Assessment & Plan Note (Signed)
Monitoring for stability

## 2022-08-18 NOTE — Assessment & Plan Note (Signed)
Managed with lipitor 10 needs fasting lipids checked

## 2022-08-18 NOTE — Assessment & Plan Note (Addendum)
Well controlled Continue meds  F/u 6 mo

## 2022-08-19 DIAGNOSIS — D696 Thrombocytopenia, unspecified: Secondary | ICD-10-CM | POA: Diagnosis not present

## 2022-08-19 DIAGNOSIS — E785 Hyperlipidemia, unspecified: Secondary | ICD-10-CM | POA: Diagnosis not present

## 2022-08-19 DIAGNOSIS — L97522 Non-pressure chronic ulcer of other part of left foot with fat layer exposed: Secondary | ICD-10-CM | POA: Diagnosis not present

## 2022-08-19 DIAGNOSIS — Z125 Encounter for screening for malignant neoplasm of prostate: Secondary | ICD-10-CM | POA: Diagnosis not present

## 2022-08-20 ENCOUNTER — Telehealth: Payer: Self-pay | Admitting: Orthopedic Surgery

## 2022-08-20 LAB — CBC WITH DIFFERENTIAL/PLATELET
Basophils Absolute: 0 10*3/uL (ref 0.0–0.2)
Basos: 1 %
EOS (ABSOLUTE): 0.3 10*3/uL (ref 0.0–0.4)
Eos: 4 %
Hematocrit: 42 % (ref 37.5–51.0)
Hemoglobin: 13.8 g/dL (ref 13.0–17.7)
Immature Grans (Abs): 0 10*3/uL (ref 0.0–0.1)
Immature Granulocytes: 1 %
Lymphocytes Absolute: 0.9 10*3/uL (ref 0.7–3.1)
Lymphs: 15 %
MCH: 30 pg (ref 26.6–33.0)
MCHC: 32.9 g/dL (ref 31.5–35.7)
MCV: 91 fL (ref 79–97)
Monocytes Absolute: 0.4 10*3/uL (ref 0.1–0.9)
Monocytes: 6 %
Neutrophils Absolute: 4.6 10*3/uL (ref 1.4–7.0)
Neutrophils: 73 %
Platelets: 136 10*3/uL — ABNORMAL LOW (ref 150–450)
RBC: 4.6 x10E6/uL (ref 4.14–5.80)
RDW: 13.3 % (ref 11.6–15.4)
WBC: 6.3 10*3/uL (ref 3.4–10.8)

## 2022-08-20 LAB — LIPID PANEL
Chol/HDL Ratio: 4.8 ratio (ref 0.0–5.0)
Cholesterol, Total: 164 mg/dL (ref 100–199)
HDL: 34 mg/dL — ABNORMAL LOW (ref >39)
LDL Chol Calc (NIH): 89 mg/dL (ref 0–99)
Triglycerides: 241 mg/dL — ABNORMAL HIGH (ref 0–149)
VLDL Cholesterol Cal: 41 mg/dL — ABNORMAL HIGH (ref 5–40)

## 2022-08-20 LAB — PSA: Prostate Specific Ag, Serum: 1.9 ng/mL (ref 0.0–4.0)

## 2022-08-20 NOTE — Telephone Encounter (Signed)
Spoke with patient regarding EMG results.   EMG of bilateral lower extremities dated 07/13/22 is personally reviewed and shows:   This is an abnormal electrodiagnostic study consistent with a generalized severe sensorimotor polyneuropathy (this study may over estimate degree of peripheral neuropathy secondary to edema).   Briefly reviewed results with patient. Symptoms are likely from above polyneuropathy and not lumbar mediated.   Will hold on further imaging of lumbar spine (MRI) for now and have him follow up with neurology as scheduled.   He will call back with any further questions or concerns.

## 2022-09-06 ENCOUNTER — Ambulatory Visit (INDEPENDENT_AMBULATORY_CARE_PROVIDER_SITE_OTHER): Payer: 59

## 2022-09-06 ENCOUNTER — Ambulatory Visit (INDEPENDENT_AMBULATORY_CARE_PROVIDER_SITE_OTHER): Payer: 59 | Admitting: Vascular Surgery

## 2022-09-06 ENCOUNTER — Encounter (INDEPENDENT_AMBULATORY_CARE_PROVIDER_SITE_OTHER): Payer: Self-pay | Admitting: Vascular Surgery

## 2022-09-06 VITALS — BP 152/72 | HR 74 | Resp 18 | Ht 74.0 in | Wt 282.9 lb

## 2022-09-06 DIAGNOSIS — I872 Venous insufficiency (chronic) (peripheral): Secondary | ICD-10-CM | POA: Diagnosis not present

## 2022-09-06 DIAGNOSIS — K219 Gastro-esophageal reflux disease without esophagitis: Secondary | ICD-10-CM | POA: Diagnosis not present

## 2022-09-06 DIAGNOSIS — E785 Hyperlipidemia, unspecified: Secondary | ICD-10-CM

## 2022-09-06 DIAGNOSIS — I10 Essential (primary) hypertension: Secondary | ICD-10-CM | POA: Diagnosis not present

## 2022-09-06 NOTE — Progress Notes (Signed)
MRN : [id number]  Darrell Montes. is a 61 y.o. ([birth year]) male who presents with chief complaint of legs swell.  History of Present Illness:   Patient is seen for evaluation of leg pain and leg swelling. The patient first noticed the swelling remotely. The swelling is associated with pain and discoloration. The pain and swelling worsens with prolonged dependency and improves with elevation. The pain is unrelated to activity.  The patient notes that in the morning the legs are significantly improved but they steadily worsened throughout the course of the day. The patient also notes a steady worsening of the discoloration in the ankle and shin area.   The patient denies claudication symptoms.  The patient denies symptoms consistent with rest pain.  The patient denies and extensive history of DJD and LS spine disease.  The patient has no had any past angiography, interventions or vascular surgery.  Elevation makes the leg symptoms better, dependency makes them much worse. There is no history of ulcerations. The patient denies any recent changes in medications.  The patient has not been wearing graduated compression.  The patient has a history of DVT no Hx of PE. There is no prior history of phlebitis.  There is no history of primary lymphedema.  No history of malignancies. No history of trauma or groin or pelvic surgery. There is no history of radiation treatment to the groin or pelvis  The patient denies amaurosis fugax or recent TIA symptoms. There are no recent neurological changes noted. The patient denies recent episodes of angina or shortness of breath   No outpatient medications have been marked as taking for the 09/06/22 encounter (Appointment) with Delana Meyer, Dolores Lory, MD.    Past Medical History:  Diagnosis Date   Hx of blood clots    Hypertension     Past Surgical History:  Procedure Laterality Date   BACK SURGERY     X2 lower disc    KNEE SURGERY Left    X2  scope   UPPER GI ENDOSCOPY  07/11/2013   normal duodenum, benign appearing esophageal stricture x2     Social History Social History   Tobacco Use   Smoking status: Never   Smokeless tobacco: Never  Vaping Use   Vaping Use: Never used  Substance Use Topics   Alcohol use: Yes    Alcohol/week: 8.0 standard drinks of alcohol    Types: 8 Standard drinks or equivalent per week    Comment: 0-2 daily   Drug use: No    Family History Family History  Problem Relation Age of Onset   COPD Mother    Bladder Cancer Mother    Hypertension Father    COPD Father    Sleep apnea Father     No Known Allergies   REVIEW OF SYSTEMS (Negative unless checked)  Constitutional: '[]'$ Weight loss  '[]'$ Fever  '[]'$ Chills Cardiac: '[]'$ Chest pain   '[]'$ Chest pressure   '[]'$ Palpitations   '[]'$ Shortness of breath when laying flat   '[]'$ Shortness of breath with exertion. Vascular:  '[]'$ Pain in legs with walking   '[x]'$ Pain in legs with standing  '[x]'$ History of DVT   '[]'$ Phlebitis   '[x]'$ Swelling in legs   '[x]'$ Varicose veins   '[]'$ Non-healing ulcers Pulmonary:   '[]'$ Uses home oxygen   '[]'$ Productive cough   '[]'$ Hemoptysis   '[]'$ Wheeze  '[]'$ COPD   '[]'$ Asthma Neurologic:  '[]'$ Dizziness   '[]'$ Seizures   '[]'$ History of stroke   '[]'$ History of TIA  '[]'$ Aphasia   '[]'$ Vissual changes   '[]'$   Weakness or numbness in arm   '[]'$ Weakness or numbness in leg Musculoskeletal:   '[]'$ Joint swelling   '[]'$ Joint pain   '[]'$ Low back pain Hematologic:  '[]'$ Easy bruising  '[]'$ Easy bleeding   '[]'$ Hypercoagulable state   '[]'$ Anemic Gastrointestinal:  '[]'$ Diarrhea   '[]'$ Vomiting  '[x]'$ Gastroesophageal reflux/heartburn   '[]'$ Difficulty swallowing. Genitourinary:  '[]'$ Chronic kidney disease   '[]'$ Difficult urination  '[]'$ Frequent urination   '[]'$ Blood in urine Skin:  '[]'$ Rashes   '[]'$ Ulcers  Psychological:  '[]'$ History of anxiety   '[]'$  History of major depression.  Physical Examination  There were no vitals filed for this visit. There is no height or weight on file to calculate BMI. Gen: WD/WN, NAD Head: [place]/AT, No  temporalis wasting.  Ear/Nose/Throat: Hearing grossly intact, nares w/o erythema or drainage, pinna without lesions Eyes: PER, EOMI, sclera nonicteric.  Neck: Supple, no gross masses.  No JVD.  Pulmonary:  Good air movement, no audible wheezing, no use of accessory muscles.  Cardiac: RRR, precordium not hyperdynamic. Vascular:  Varicosities present 6-8 mm left lower extremity.  Moderate venous stasis changes to the legs bilaterally.  3+ soft pitting edema  Vessel Right Left  Radial Palpable Palpable  Gastrointestinal: soft, non-distended. No guarding/no peritoneal signs.  Musculoskeletal: M/S 5/5 throughout.  No deformity.  Neurologic: CN 2-12 intact. Pain and light touch intact in extremities.  Symmetrical.  Speech is fluent. Motor exam as listed above. Psychiatric: Judgment intact, Mood & affect appropriate for pt's clinical situation. Dermatologic: Venous rashes no ulcers noted.  No changes consistent with cellulitis. Lymph : No lichenification or skin changes of chronic lymphedema.  CBC Lab Results  Component Value Date   WBC 6.3 08/19/2022   HGB 13.8 08/19/2022   HCT 42.0 08/19/2022   MCV 91 08/19/2022   PLT 136 (L) 08/19/2022    BMET    Component Value Date/Time   NA 144 05/18/2022 0853   K 4.2 05/18/2022 0853   CL 101 05/18/2022 0853   CO2 26 05/18/2022 0853   GLUCOSE 116 (H) 05/18/2022 0853   BUN 14 05/18/2022 0853   CREATININE 0.73 (L) 05/18/2022 0853   CALCIUM 9.6 05/18/2022 0853   GFRNONAA 102 03/19/2020 0818   GFRAA 118 03/19/2020 0818   CrCl cannot be calculated (Patient's most recent lab result is older than the maximum 21 days allowed.).  COAG No results found for: "INR", "PROTIME"  Radiology No results found.   Assessment/Plan 1. Venous insufficiency (chronic) (peripheral) Recommend:  No surgery or intervention at this point in time.  I have reviewed my discussion with the patient regarding venous insufficiency and why it causes symptoms. I have  discussed with the patient the chronic skin changes that accompany venous insufficiency and the long term sequela such as ulceration. Patient will contnue wearing graduated compression stockings on a daily basis, as this has provided excellent control of his edema. The patient will put the stockings on first thing in the morning and removing them in the evening. The patient is reminded not to sleep in the stockings.  In addition, behavioral modification including elevation during the day will be initiated. Exercise is strongly encouraged.  Duplex ultrasound of the lower extremities shows deep venous reflux, mild to moderate superficial reflux was identified.  Ablation was discussed but en light of the post phlebitic changes we have elected to no pursue ablation at this time.  Given the patient's good control and lack of any problems regarding the venous insufficiency and lymphedema a lymph pump in not need at this time.  The patient will follow up with me PRN should anything change.  The patient voices agreement with this plan.   2. Essential (primary) hypertension Continue antihypertensive medications as already ordered, these medications have been reviewed and there are no changes at this time.   3. Hyperlipidemia, unspecified hyperlipidemia type Continue statin as ordered and reviewed, no changes at this time   4. Gastroesophageal reflux disease, unspecified whether esophagitis present Continue PPI as already ordered, this medication has been reviewed and there are no changes at this time.  Avoidence of caffeine and alcohol  Moderate elevation of the head of the bed      Hortencia Pilar, MD  09/06/2022 8:30 AM

## 2022-09-11 ENCOUNTER — Encounter (INDEPENDENT_AMBULATORY_CARE_PROVIDER_SITE_OTHER): Payer: Self-pay | Admitting: Vascular Surgery

## 2022-09-27 ENCOUNTER — Encounter (INDEPENDENT_AMBULATORY_CARE_PROVIDER_SITE_OTHER): Payer: Self-pay

## 2022-09-27 ENCOUNTER — Encounter: Payer: Self-pay | Admitting: Physician Assistant

## 2022-09-27 DIAGNOSIS — R202 Paresthesia of skin: Secondary | ICD-10-CM | POA: Diagnosis not present

## 2022-09-27 DIAGNOSIS — R7303 Prediabetes: Secondary | ICD-10-CM | POA: Diagnosis not present

## 2022-09-27 DIAGNOSIS — I10 Essential (primary) hypertension: Secondary | ICD-10-CM | POA: Diagnosis not present

## 2022-09-27 DIAGNOSIS — R600 Localized edema: Secondary | ICD-10-CM | POA: Diagnosis not present

## 2022-09-27 DIAGNOSIS — G4733 Obstructive sleep apnea (adult) (pediatric): Secondary | ICD-10-CM | POA: Diagnosis not present

## 2022-09-29 ENCOUNTER — Encounter (INDEPENDENT_AMBULATORY_CARE_PROVIDER_SITE_OTHER): Payer: 59 | Admitting: Physician Assistant

## 2022-09-29 DIAGNOSIS — G4733 Obstructive sleep apnea (adult) (pediatric): Secondary | ICD-10-CM

## 2022-10-01 NOTE — Telephone Encounter (Signed)
Please see the MyChart message reply(ies) for my assessment and plan.    This patient gave consent for this Medical Advice Message and is aware that it may result in a bill to [employer], as well as the possibility of receiving a bill for a co-payment or deductible. They are an established patient, but are not seeking medical advice exclusively about a problem treated during an in person or video visit in the last seven days. I did not recommend an in person or video visit within seven days of my reply.    I spent a total of 8 minutes cumulative time within 7 days through [employer].  Mikey Kirschner, PA-C

## 2022-10-14 DIAGNOSIS — Z1211 Encounter for screening for malignant neoplasm of colon: Secondary | ICD-10-CM | POA: Diagnosis not present

## 2022-10-14 DIAGNOSIS — Z01818 Encounter for other preprocedural examination: Secondary | ICD-10-CM | POA: Diagnosis not present

## 2022-10-15 ENCOUNTER — Encounter: Payer: Self-pay | Admitting: Physician Assistant

## 2022-10-19 ENCOUNTER — Other Ambulatory Visit: Payer: Self-pay | Admitting: Physician Assistant

## 2022-10-19 DIAGNOSIS — R351 Nocturia: Secondary | ICD-10-CM

## 2022-10-29 ENCOUNTER — Ambulatory Visit: Payer: 59 | Admitting: Urology

## 2022-10-29 ENCOUNTER — Encounter: Payer: Self-pay | Admitting: Urology

## 2022-10-29 VITALS — BP 166/97 | HR 88 | Ht 74.0 in | Wt 280.0 lb

## 2022-10-29 DIAGNOSIS — R351 Nocturia: Secondary | ICD-10-CM | POA: Diagnosis not present

## 2022-10-29 LAB — URINALYSIS, COMPLETE
Bilirubin, UA: NEGATIVE
Glucose, UA: NEGATIVE
Ketones, UA: NEGATIVE
Leukocytes,UA: NEGATIVE
Nitrite, UA: NEGATIVE
Protein,UA: NEGATIVE
Specific Gravity, UA: 1.02 (ref 1.005–1.030)
Urobilinogen, Ur: 0.2 mg/dL (ref 0.2–1.0)
pH, UA: 5.5 (ref 5.0–7.5)

## 2022-10-29 LAB — MICROSCOPIC EXAMINATION

## 2022-10-29 LAB — BLADDER SCAN AMB NON-IMAGING: Scan Result: 20

## 2022-10-29 MED ORDER — TROSPIUM CHLORIDE 20 MG PO TABS: 20.0000 mg | ORAL_TABLET | Freq: Two times a day (BID) | ORAL | 0 refills | Status: DC

## 2022-10-29 NOTE — Progress Notes (Signed)
10/29/2022 8:58 AM   Darrell Montes. 11-30-1960 [id number]  Referring provider: Mikey Kirschner, PA-C [address] [place],  [place] [address]  Chief Complaint  Patient presents with   Other    HPI: 61 y.o. male referred for nocturia.  1 month history of nocturia x 3-4 No daytime voiding symptoms His nighttime volumes are normal Has been on tamsulosin though cannot remember how long he has been taking. He has been diagnosed with sleep apnea but unable to tolerate CPAP.  He was going to be fitted for a retainer to help with his sleep apnea however this was not covered by insurance When he sleeps upright in a recliner he does not have nocturia IPSS today 13/35 No dysuria or gross hematuria No flank, abdominal or pelvic pain   PMH: Past Medical History:  Diagnosis Date   Hx of blood clots    Hypertension     Surgical History: Past Surgical History:  Procedure Laterality Date   BACK SURGERY     X2 lower disc    KNEE SURGERY Left    X2 scope   UPPER GI ENDOSCOPY  07/11/2013   normal duodenum, benign appearing esophageal stricture x2     Home Medications:  Allergies as of 10/29/2022   No Known Allergies      Medication List        Accurate as of October 29, 2022  8:58 AM. If you have any questions, ask your nurse or doctor.          amLODipine 5 MG tablet Commonly known as: NORVASC Take 1 tablet (5 mg total) by mouth daily.   atorvastatin 10 MG tablet Commonly known as: LIPITOR Take 1 tablet (10 mg total) by mouth daily.   etodolac 500 MG tablet Commonly known as: LODINE Take 500 mg by mouth 2 (two) times daily.   gabapentin 300 MG capsule Commonly known as: NEURONTIN Take by mouth.   loratadine 10 MG tablet Commonly known as: CLARITIN Take 1 tablet (10 mg total) by mouth daily. What changed: additional instructions   losartan 50 MG tablet Commonly known as: COZAAR Take 1 tablet by mouth daily.   Multiple Vitamin tablet Take by  mouth.   pantoprazole 40 MG tablet Commonly known as: PROTONIX TAKE 1 TABLET BY MOUTH ONCE DAILY   psyllium 58.6 % powder Commonly known as: METAMUCIL Take 1 packet by mouth 3 (three) times daily.   tamsulosin 0.4 MG Caps capsule Commonly known as: FLOMAX Take 1 capsule (0.4 mg total) by mouth daily.   trospium 20 MG tablet Commonly known as: SANCTURA Take 1 tablet (20 mg total) by mouth 2 (two) times daily. Started by: Abbie Sons, MD   Vitamin D (Ergocalciferol) 1.25 MG (50000 UNIT) Caps capsule Commonly known as: DRISDOL Take 50,000 Units by mouth every 7 (seven) days.        Allergies: No Known Allergies  Family History: Family History  Problem Relation Age of Onset   COPD Mother    Bladder Cancer Mother    Hypertension Father    COPD Father    Sleep apnea Father     Social History:  reports that he has never smoked. He has never used smokeless tobacco. He reports current alcohol use of about 8.0 standard drinks of alcohol per week. He reports that he does not use drugs.   Physical Exam: BP (!) 166/97   Pulse 88   Ht '6\' 2"'$  (1.88 m)   Wt 280  lb (127 kg)   BMI 35.95 kg/m   Constitutional:  Alert and oriented, No acute distress. HEENT: [place] AT, moist mucus membranes.  Trachea midline, no masses. Cardiovascular: No clubbing, cyanosis, or edema. Respiratory: Normal respiratory effort, no increased work of breathing. GU: Prostate difficult to palpate secondary to body habitus.  Only lower one third palpable and no nodules or induration Psychiatric: Normal mood and affect.   Assessment & Plan:    1. Nocturia PVR today 20 mL Urinalysis unremarkable We discussed multiple etiologies of nocturia and untreated sleep apnea is a common cause.  Based on lack of symptoms when sleeping upright symptoms are most likely secondary to sleep apnea.  OAB secondary to BPH and nocturnal polyuria were also discussed. Will give a trial of trospium 20 mg at bedtime to see if  this makes any difference If no improvement we will have him do a voiding diary to check his nighttime voided volumes and if evidence of nocturnal polyuria consider trial of DDAVP   Abbie Sons, MD  [facility] [address] [place], [place] [address] [phone]

## 2022-11-11 ENCOUNTER — Encounter (INDEPENDENT_AMBULATORY_CARE_PROVIDER_SITE_OTHER): Payer: 59 | Admitting: Physician Assistant

## 2022-11-11 DIAGNOSIS — I10 Essential (primary) hypertension: Secondary | ICD-10-CM

## 2022-11-12 NOTE — Telephone Encounter (Signed)

## 2022-11-19 ENCOUNTER — Other Ambulatory Visit: Payer: Self-pay | Admitting: Urology

## 2022-11-26 ENCOUNTER — Other Ambulatory Visit: Payer: Self-pay | Admitting: Physician Assistant

## 2022-11-26 DIAGNOSIS — I10 Essential (primary) hypertension: Secondary | ICD-10-CM

## 2022-12-03 ENCOUNTER — Telehealth: Payer: Self-pay | Admitting: Physician Assistant

## 2022-12-03 MED ORDER — LOSARTAN POTASSIUM 50 MG PO TABS: 50.0000 mg | ORAL_TABLET | Freq: Every day | ORAL | 1 refills | Status: DC

## 2022-12-03 NOTE — Telephone Encounter (Signed)
Medication Refill - Medication: losartan (COZAAR) 50 MG tablet   Has the patient contacted their pharmacy? Yes.   Patient has no refills. PCP recently increased dosage from '50mg'$  to '100mg'$  and patient went through current rx quicker.  Preferred Pharmacy (with phone number or street name):  [facility]. Phone: [phone]  Fax: [phone]     Has the patient been seen for an appointment in the last year OR does the patient have an upcoming appointment? Yes.    Agent: Please be advised that RX refills may take up to 3 business days. We ask that you follow-up with your pharmacy.

## 2022-12-03 NOTE — Telephone Encounter (Signed)
Requested medications are due for refill today.  Unsure  Requested medications are on the active medications list.  Yes - as historical  Last refill. 08/10/2021  Future visit scheduled.   yes  Notes to clinic.  Medication listed as historical. Per request pt is now taking 100 mg.    Requested Prescriptions  Pending Prescriptions Disp Refills   losartan (COZAAR) 50 MG tablet      Sig: Take 1 tablet (50 mg total) by mouth daily.     Cardiovascular:  Angiotensin Receptor Blockers Failed - 12/03/2022  9:17 AM      Failed - Cr in normal range and within 180 days    Creatinine, Ser  Date Value Ref Range Status  05/18/2022 0.73 (L) 0.76 - 1.27 mg/dL Final         Failed - K in normal range and within 180 days    Potassium  Date Value Ref Range Status  05/18/2022 4.2 3.5 - 5.2 mmol/L Final         Failed - Last BP in normal range    BP Readings from Last 1 Encounters:  10/29/22 (!) 166/97         Passed - Patient is not pregnant      Passed - Valid encounter within last 6 months    Recent Outpatient Visits           3 months ago Essential (primary) hypertension   [facility], [place], PA-C   6 months ago Essential (primary) hypertension   [employer] [facility], [place], PA-C   8 months ago Cellulitis of great toe of left foot   [facility] Thedore Mins, [place], PA-C   2 years ago Essential (primary) hypertension   [facility], Vickki Muff, PA-C   3 years ago Annual physical exam   [facility] Jerrol Banana., MD       Future Appointments             In 1 week Thedore Mins, Ria Comment, PA-C [facility], [place]   In 2 months Thedore Mins, Ria Comment, PA-C [facility], [place]

## 2022-12-08 NOTE — Telephone Encounter (Signed)
Pt is calling back to follow up on medication refill. Pt stated PCP recently increased dosage from 50 mg to 100 mg and went through the current Rx quicker. Stated the pharmacy advised him they would not fill it because he is not due for it yet. Pt is requesting a new Rx be sent with his correct dose of 100 mg.  Has been out of medication since Monday.  Please advise.

## 2022-12-08 NOTE — Telephone Encounter (Signed)
Pt called to check status of refill, reports that he is completely out of his current supply. He has been out since Monday. Says they cannot fill the current prescription because he has been taking double   Darrell Montes Lena, Darrell Montes.  [place] [address]  Phone: [phone] Fax: [phone]

## 2022-12-09 ENCOUNTER — Other Ambulatory Visit: Payer: Self-pay | Admitting: Physician Assistant

## 2022-12-09 MED ORDER — LOSARTAN POTASSIUM 100 MG PO TABS: 100.0000 mg | ORAL_TABLET | Freq: Every day | ORAL | 1 refills | Status: DC

## 2022-12-09 MED ORDER — LOSARTAN POTASSIUM 50 MG PO TABS: 100.0000 mg | ORAL_TABLET | Freq: Every day | ORAL | 1 refills | Status: DC

## 2022-12-09 NOTE — Addendum Note (Signed)
Addended by: Barnie Mort on: 12/09/2022 08:52 AM   Modules accepted: Orders

## 2022-12-09 NOTE — Telephone Encounter (Signed)
RX resent with correct signature  Patient taking 2 tabs daily. Please refer to patient message from 11/11/22

## 2022-12-15 NOTE — Progress Notes (Signed)
Established patient visit   Patient: Darrell Montes.   DOB: 10/04/1961   62 y.o. Male  MRN: [id number] Visit Date: 12/16/2022  Today's healthcare provider: Mikey Kirschner, PA-C   Cc. Htn f/u  Subjective    HPI  Pt has concerned over weight gain, increasing levels of fatigue, despite eating a balanced diet and exercising frequently.  Hypertension, follow-up  BP Readings from Last 3 Encounters:  12/16/22 (!) 154/84  10/29/22 (!) 166/97  09/06/22 (!) 152/72   Wt Readings from Last 3 Encounters:  12/16/22 291 lb (132 kg)  10/29/22 280 lb (127 kg)  09/06/22 282 lb 14.4 oz (128.3 kg)     Management since that visit includes increasing losartan from 50 mg to 100 mg, continuing amlodipine 5 mg.  He reports excellent compliance with treatment. He is not having side effects.  He is following a Regular diet. He is exercising. He does not smoke.  Use of agents associated with hypertension: none.   Outside blood pressures are similar to today. Reports 150s/80s. Symptoms: No chest pain No chest pressure  No palpitations No syncope  No dyspnea No orthopnea  No paroxysmal nocturnal dyspnea No lower extremity edema   Pertinent labs Lab Results  Component Value Date   CHOL 164 08/19/2022   HDL 34 (L) 08/19/2022   LDLCALC 89 08/19/2022   TRIG 241 (H) 08/19/2022   CHOLHDL 4.8 08/19/2022   Lab Results  Component Value Date   NA 144 05/18/2022   K 4.2 05/18/2022   CREATININE 0.73 (L) 05/18/2022   EGFR 104 05/18/2022   GLUCOSE 116 (H) 05/18/2022   TSH 2.440 07/17/2019     The ASCVD Risk score (Arnett DK, et al., 2019) failed to calculate for the following reasons:   Unable to determine if patient is Non-Hispanic African American  ---------------------------------------------------------------------------------------------------   Medications: Outpatient Medications Prior to Visit  Medication Sig   atorvastatin (LIPITOR) 10 MG tablet Take 1 tablet (10 mg total) by  mouth daily.   etodolac (LODINE) 500 MG tablet Take 500 mg by mouth 2 (two) times daily.   loratadine (CLARITIN) 10 MG tablet Take 1 tablet (10 mg total) by mouth daily. (Patient taking differently: Take 10 mg by mouth daily. Takes as needed)   losartan (COZAAR) 100 MG tablet Take 1 tablet (100 mg total) by mouth daily.   Multiple Vitamin tablet Take by mouth.   pantoprazole (PROTONIX) 40 MG tablet TAKE 1 TABLET BY MOUTH ONCE DAILY   psyllium (METAMUCIL) 58.6 % powder Take 1 packet by mouth 3 (three) times daily.   tamsulosin (FLOMAX) 0.4 MG CAPS capsule Take 1 capsule (0.4 mg total) by mouth daily.   trospium (SANCTURA) 20 MG tablet TAKE 1 TABLET BY MOUTH TWICE DAILY   Vitamin D, Ergocalciferol, (DRISDOL) 1.25 MG (50000 UNIT) CAPS capsule Take 50,000 Units by mouth every 7 (seven) days.   [DISCONTINUED] amLODipine (NORVASC) 5 MG tablet TAKE 1 TABLET BY MOUTH ONCE DAILY   gabapentin (NEURONTIN) 300 MG capsule Take by mouth.   No facility-administered medications prior to visit.   Review of Systems  Constitutional:  Positive for fatigue and unexpected weight change. Negative for fever.  Respiratory:  Negative for cough and shortness of breath.   Cardiovascular:  Negative for chest pain, palpitations and leg swelling.  Neurological:  Negative for dizziness and headaches.      Objective    Blood pressure (!) 154/84, temperature 98.7 F (37.1 C), height '6\' 2"'$  (1.88  m), weight 291 lb (132 kg).   Physical Exam Constitutional:      General: He is awake.     Appearance: He is well-developed.  HENT:     Head: Normocephalic.  Eyes:     Conjunctiva/sclera: Conjunctivae normal.  Cardiovascular:     Rate and Rhythm: Normal rate and regular rhythm.     Heart sounds: Normal heart sounds.  Pulmonary:     Effort: Pulmonary effort is normal.     Breath sounds: Normal breath sounds.  Musculoskeletal:     Right lower leg: No edema.     Left lower leg: No edema.  Skin:    General: Skin is  warm.  Neurological:     Mental Status: He is alert and oriented to person, place, and time.  Psychiatric:        Attention and Perception: Attention normal.        Mood and Affect: Mood normal.        Speech: Speech normal.        Behavior: Behavior is cooperative.      No results found for any visits on 12/16/22.  Assessment & Plan     Problem List Items Addressed This Visit       Cardiovascular and Mediastinum   Essential (primary) hypertension - Primary    Meds were increased -- losartan 50 mg to now 100 mg daily, continuing amlodipine 5 mg. Still elevated in office and at home. Pt has had around a ~10 pound weight gain since he was controlled. See sleep apnea note. Will check cmp, tsh/t4, recheck A1c. For now-- increase amlodipine to 10 mg and continue losartan 100 mg daily. F/u 4 weeks      Relevant Medications   amLODipine (NORVASC) 5 MG tablet   Other Relevant Orders   Comprehensive Metabolic Panel (CMET)     Respiratory   Obstructive apnea    Failed cpap  Patient was trying to get mouth guard but not covered by insurance. I had referred to an ENT clinic that does inspire but patient has not followed up Encouraged him to try a different cpap mask. Advised uncontrolled sleep apnea can lead to hypertension, weight gain, fatigue.           Other   Weight gain    Will check tsh/t4 May be 2/2 uncontrolled sleep apnea Continue with diet and exercise      Relevant Orders   TSH + free T4   Prediabetes    Will repeat A1c given ~10 pound weight gain      Relevant Orders   HgB A1c   Other Visit Diagnoses     Need for COVID-19 vaccine       Relevant Orders   [employer] Fall 2023 Covid-19 Vaccine 72yr and older (Completed)        Return in about 4 weeks (around 01/13/2023) for hypertension.      I, LMikey Kirschner PA-C have reviewed all documentation for this visit. The documentation on  12/16/22  for the exam, diagnosis, procedures, and orders are all  accurate and complete.  LMikey Kirschner PA-C [facility] [place] [address]Office: [phone]: [phone]

## 2022-12-16 ENCOUNTER — Ambulatory Visit (INDEPENDENT_AMBULATORY_CARE_PROVIDER_SITE_OTHER): Payer: 59 | Admitting: Physician Assistant

## 2022-12-16 ENCOUNTER — Encounter: Payer: Self-pay | Admitting: Physician Assistant

## 2022-12-16 VITALS — BP 154/84 | Temp 98.7°F | Ht 74.0 in | Wt 291.0 lb

## 2022-12-16 DIAGNOSIS — R7303 Prediabetes: Secondary | ICD-10-CM | POA: Diagnosis not present

## 2022-12-16 DIAGNOSIS — R635 Abnormal weight gain: Secondary | ICD-10-CM | POA: Insufficient documentation

## 2022-12-16 DIAGNOSIS — I10 Essential (primary) hypertension: Secondary | ICD-10-CM | POA: Diagnosis not present

## 2022-12-16 DIAGNOSIS — Z23 Encounter for immunization: Secondary | ICD-10-CM | POA: Diagnosis not present

## 2022-12-16 DIAGNOSIS — G4733 Obstructive sleep apnea (adult) (pediatric): Secondary | ICD-10-CM | POA: Diagnosis not present

## 2022-12-16 MED ORDER — AMLODIPINE BESYLATE 5 MG PO TABS: 10.0000 mg | ORAL_TABLET | Freq: Every day | ORAL | 5 refills | Status: DC

## 2022-12-16 MED ORDER — COVID-19 MRNA 2023-2024 VACCINE (COMIRNATY) 0.3 ML INJECTION: 0.3000 mL | Freq: Once | INTRAMUSCULAR | 0 refills | Status: DC

## 2022-12-16 NOTE — Assessment & Plan Note (Signed)
Will check tsh/t4 May be 2/2 uncontrolled sleep apnea Continue with diet and exercise

## 2022-12-16 NOTE — Assessment & Plan Note (Addendum)
Meds were increased -- losartan 50 mg to now 100 mg daily, continuing amlodipine 5 mg. Still elevated in office and at home. Pt has had around a ~10 pound weight gain since he was controlled. See sleep apnea note. Will check cmp, tsh/t4, recheck A1c. For now-- increase amlodipine to 10 mg and continue losartan 100 mg daily. F/u 4 weeks

## 2022-12-16 NOTE — Assessment & Plan Note (Addendum)
Failed cpap  Patient was trying to get mouth guard but not covered by insurance. I had referred to an ENT clinic that does inspire but patient has not followed up Encouraged him to try a different cpap mask. Advised uncontrolled sleep apnea can lead to hypertension, weight gain, fatigue.

## 2022-12-16 NOTE — Assessment & Plan Note (Signed)
Will repeat A1c given ~10 pound weight gain

## 2022-12-17 LAB — COMPREHENSIVE METABOLIC PANEL
ALT: 36 IU/L (ref 0–44)
AST: 28 IU/L (ref 0–40)
Albumin/Globulin Ratio: 2 (ref 1.2–2.2)
Albumin: 4.5 g/dL (ref 3.9–4.9)
Alkaline Phosphatase: 97 IU/L (ref 44–121)
BUN/Creatinine Ratio: 15 (ref 10–24)
BUN: 11 mg/dL (ref 8–27)
Bilirubin Total: 0.4 mg/dL (ref 0.0–1.2)
CO2: 28 mmol/L (ref 20–29)
Calcium: 9.5 mg/dL (ref 8.6–10.2)
Chloride: 103 mmol/L (ref 96–106)
Creatinine, Ser: 0.74 mg/dL — ABNORMAL LOW (ref 0.76–1.27)
Globulin, Total: 2.2 g/dL (ref 1.5–4.5)
Glucose: 104 mg/dL — ABNORMAL HIGH (ref 70–99)
Potassium: 4.4 mmol/L (ref 3.5–5.2)
Sodium: 145 mmol/L — ABNORMAL HIGH (ref 134–144)
Total Protein: 6.7 g/dL (ref 6.0–8.5)
eGFR: 103 mL/min/{1.73_m2} (ref >59)

## 2022-12-17 LAB — TSH+FREE T4
Free T4: 0.9 ng/dL (ref 0.82–1.77)
TSH: 2.51 u[IU]/mL (ref 0.450–4.500)

## 2022-12-17 LAB — HEMOGLOBIN A1C
Est. average glucose Bld gHb Est-mCnc: 123 mg/dL
Hgb A1c MFr Bld: 5.9 % — ABNORMAL HIGH (ref 4.8–5.6)

## 2022-12-19 ENCOUNTER — Encounter: Payer: Self-pay | Admitting: Physician Assistant

## 2022-12-23 ENCOUNTER — Other Ambulatory Visit: Payer: Self-pay | Admitting: Physician Assistant

## 2022-12-23 DIAGNOSIS — E785 Hyperlipidemia, unspecified: Secondary | ICD-10-CM

## 2022-12-24 ENCOUNTER — Other Ambulatory Visit: Payer: Self-pay | Admitting: Physician Assistant

## 2022-12-24 MED ORDER — PANTOPRAZOLE SODIUM 40 MG PO TBEC: 40.0000 mg | DELAYED_RELEASE_TABLET | Freq: Every day | ORAL | 3 refills | Status: AC

## 2023-01-03 ENCOUNTER — Other Ambulatory Visit: Payer: Self-pay | Admitting: Urology

## 2023-01-12 ENCOUNTER — Encounter: Payer: Self-pay | Admitting: Gastroenterology

## 2023-01-13 ENCOUNTER — Ambulatory Visit
Admission: RE | Admit: 2023-01-13 | Discharge: 2023-01-13 | Disposition: A | Discharge status: Home / Self Care | Payer: 59 | Attending: Gastroenterology | Admitting: Gastroenterology

## 2023-01-13 ENCOUNTER — Encounter
Admission: RE | Disposition: A | Discharge status: Home / Self Care | Payer: Self-pay | Source: Home / Self Care | Attending: Gastroenterology

## 2023-01-13 ENCOUNTER — Ambulatory Visit: Payer: 59 | Admitting: Anesthesiology

## 2023-01-13 ENCOUNTER — Encounter: Payer: Self-pay | Admitting: Gastroenterology

## 2023-01-13 DIAGNOSIS — G4730 Sleep apnea, unspecified: Secondary | ICD-10-CM | POA: Insufficient documentation

## 2023-01-13 DIAGNOSIS — D122 Benign neoplasm of ascending colon: Secondary | ICD-10-CM | POA: Diagnosis not present

## 2023-01-13 DIAGNOSIS — K5730 Diverticulosis of large intestine without perforation or abscess without bleeding: Secondary | ICD-10-CM | POA: Insufficient documentation

## 2023-01-13 DIAGNOSIS — Z1211 Encounter for screening for malignant neoplasm of colon: Secondary | ICD-10-CM | POA: Insufficient documentation

## 2023-01-13 DIAGNOSIS — K635 Polyp of colon: Secondary | ICD-10-CM | POA: Diagnosis not present

## 2023-01-13 DIAGNOSIS — K219 Gastro-esophageal reflux disease without esophagitis: Secondary | ICD-10-CM | POA: Diagnosis not present

## 2023-01-13 DIAGNOSIS — D123 Benign neoplasm of transverse colon: Secondary | ICD-10-CM | POA: Diagnosis not present

## 2023-01-13 DIAGNOSIS — I10 Essential (primary) hypertension: Secondary | ICD-10-CM | POA: Insufficient documentation

## 2023-01-13 DIAGNOSIS — K640 First degree hemorrhoids: Secondary | ICD-10-CM | POA: Diagnosis not present

## 2023-01-13 DIAGNOSIS — Z86718 Personal history of other venous thrombosis and embolism: Secondary | ICD-10-CM | POA: Diagnosis not present

## 2023-01-13 HISTORY — DX: Cellulitis of left toe: L03.032

## 2023-01-13 HISTORY — DX: Gastro-esophageal reflux disease without esophagitis: K21.9

## 2023-01-13 HISTORY — PX: COLONOSCOPY WITH PROPOFOL: SHX5780

## 2023-01-13 HISTORY — DX: Sleep apnea, unspecified: G47.30

## 2023-01-13 HISTORY — DX: Idiopathic aseptic necrosis of left femur: M87.052

## 2023-01-13 SURGERY — COLONOSCOPY WITH PROPOFOL
Anesthesia: General

## 2023-01-13 MED ORDER — FENTANYL CITRATE (PF) 100 MCG/2ML IJ SOLN
INTRAMUSCULAR | Status: AC
  Filled 2023-01-13: qty 2

## 2023-01-13 MED ORDER — MIDAZOLAM HCL 2 MG/2ML IJ SOLN
INTRAMUSCULAR | Status: DC | PRN
  Administered 2023-01-13: 2 mg via INTRAVENOUS

## 2023-01-13 MED ORDER — PROPOFOL 10 MG/ML IV BOLUS
INTRAVENOUS | Status: DC | PRN
  Administered 2023-01-13 (×2): 50 mg via INTRAVENOUS

## 2023-01-13 MED ORDER — MIDAZOLAM HCL 2 MG/2ML IJ SOLN
INTRAMUSCULAR | Status: AC
  Filled 2023-01-13: qty 2

## 2023-01-13 MED ORDER — PROPOFOL 500 MG/50ML IV EMUL
INTRAVENOUS | Status: DC | PRN
  Administered 2023-01-13: 100 ug/kg/min via INTRAVENOUS

## 2023-01-13 MED ORDER — FENTANYL CITRATE (PF) 100 MCG/2ML IJ SOLN
INTRAMUSCULAR | Status: DC | PRN
  Administered 2023-01-13: 50 ug via INTRAVENOUS
  Administered 2023-01-13 (×2): 25 ug via INTRAVENOUS

## 2023-01-13 MED ORDER — SODIUM CHLORIDE 0.9 % IV SOLN: INTRAVENOUS | Status: DC

## 2023-01-13 NOTE — Interval H&P Note (Signed)
History and Physical Interval Note: Preprocedure H&P from 01/13/23  was reviewed and there was no interval change after seeing and examining the patient.  Written consent was obtained from the patient after discussion of risks, benefits, and alternatives. Patient has consented to proceed with Colonoscopy with possible intervention   01/13/2023 8:22 AM  Darrell Montes.  has presented today for surgery, with the diagnosis of colon cancer screening.  The various methods of treatment have been discussed with the patient and family. After consideration of risks, benefits and other options for treatment, the patient has consented to  Procedure(s): COLONOSCOPY WITH PROPOFOL (N/A) as a surgical intervention.  The patient's history has been reviewed, patient examined, no change in status, stable for surgery.  I have reviewed the patient's chart and labs.  Questions were answered to the patient's satisfaction.     Annamaria Helling

## 2023-01-13 NOTE — Transfer of Care (Signed)
Immediate Anesthesia Transfer of Care Note  Patient: Darrell Montes.  Procedure(s) Performed: COLONOSCOPY WITH PROPOFOL  Patient Location: PACU  Anesthesia Type:General  Level of Consciousness: awake, alert , and oriented  Airway & Oxygen Therapy: Patient Spontanous Breathing and Patient connected to nasal cannula oxygen  Post-op Assessment: Report given to RN, Post -op Vital signs reviewed and stable, and Patient moving all extremities X 4  Post vital signs: Reviewed and stable  Last Vitals:  Vitals Value Taken Time  BP 117/72 01/13/23 0913  Temp 36 C 01/13/23 0913  Pulse 72 01/13/23 0913  Resp 13 01/13/23 0913  SpO2 97 % 01/13/23 0913    Last Pain:  Vitals:   01/13/23 0913  TempSrc: Oral  PainSc: 0-No pain         Complications: No notable events documented.

## 2023-01-13 NOTE — H&P (Signed)
Pre-Procedure H&P   Patient ID: Darrell Montes. is a 62 y.o. male.  Gastroenterology Provider: Annamaria Helling, DO  Referring Provider: Mikey Kirschner, PA PCP: Mikey Kirschner, PA-C  Date: 01/13/2023  HPI [name]. is a 62 y.o. male who presents today for Colonoscopy for Screening colonoscopy .  Patient last underwent colonoscopy in February 2013 only noting sigmoid diverticulosis.  He has no family history of colon cancer or colon polyps  Patient reports daily bowel movement without melena hematochezia diarrhea or constipation.  Last labs reported creatinine 0.74 hemoglobin 14 MCV 91 platelets 136,000   Past Medical History:  Diagnosis Date   Avascular necrosis of femur head, left (HCC)    Cellulitis of great toe of left foot    GERD (gastroesophageal reflux disease)    Hx of blood clots    Hypertension    Sleep apnea     Past Surgical History:  Procedure Laterality Date   BACK SURGERY     X2 lower disc    COLONOSCOPY     KNEE SURGERY Left    X2 scope   TONSILLECTOMY     UPPER GI ENDOSCOPY  07/11/2013   normal duodenum, benign appearing esophageal stricture x2     Family History No h/o GI disease or malignancy  Review of Systems  Constitutional:  Negative for activity change, appetite change, chills, diaphoresis, fatigue, fever and unexpected weight change.  HENT:  Negative for trouble swallowing and voice change.   Respiratory:  Negative for shortness of breath and wheezing.   Cardiovascular:  Negative for chest pain, palpitations and leg swelling.  Gastrointestinal:  Negative for abdominal distention, abdominal pain, anal bleeding, blood in stool, constipation, diarrhea, nausea and vomiting.  Musculoskeletal:  Negative for arthralgias and myalgias.  Skin:  Negative for color change and pallor.  Neurological:  Negative for dizziness, syncope and weakness.  Psychiatric/Behavioral:  Negative for confusion. The patient is not nervous/anxious.    All other systems reviewed and are negative.    Medications No current facility-administered medications on file prior to encounter.   Current Outpatient Medications on File Prior to Encounter  Medication Sig Dispense Refill   clomiPHENE (CLOMID) 50 MG tablet Take 25 mg by mouth daily.     etodolac (LODINE) 500 MG tablet Take 500 mg by mouth 2 (two) times daily.     montelukast (SINGULAIR) 10 MG tablet Take 10 mg by mouth at bedtime.     tamsulosin (FLOMAX) 0.4 MG CAPS capsule Take 1 capsule (0.4 mg total) by mouth daily. 90 capsule 1   gabapentin (NEURONTIN) 300 MG capsule Take by mouth.     lisinopril (ZESTRIL) 10 MG tablet Take 10 mg by mouth daily.     loratadine (CLARITIN) 10 MG tablet Take 1 tablet (10 mg total) by mouth daily. (Patient taking differently: Take 10 mg by mouth daily. Takes as needed) 90 tablet 3   Multiple Vitamin tablet Take by mouth.     psyllium (METAMUCIL) 58.6 % powder Take 1 packet by mouth 3 (three) times daily.     traZODone (DESYREL) 100 MG tablet Take 100 mg by mouth at bedtime.     Vitamin D, Ergocalciferol, (DRISDOL) 1.25 MG (50000 UNIT) CAPS capsule Take 50,000 Units by mouth every 7 (seven) days.      Pertinent medications related to GI and procedure were reviewed by me with the patient prior to the procedure   Current Facility-Administered Medications:    0.9 %  sodium chloride infusion, , Intravenous, Continuous, Annamaria Helling, DO, Last Rate: 20 mL/hr at 01/13/23 0813, New Bag at 01/13/23 0813      Allergies  Allergen Reactions   Ace Inhibitors Cough   Allergies were reviewed by me prior to the procedure  Objective   Body mass index is 35.31 kg/m. Vitals:   01/13/23 0753  BP: (!) 152/91  Pulse: 67  Resp: 16  Temp: (!) 96.8 F (36 C)  TempSrc: Temporal  SpO2: 96%  Weight: 124.7 kg  Height: 6' 2"$  (1.88 m)     Physical Exam Vitals and nursing note reviewed.  Constitutional:      General: He is not in acute  distress.    Appearance: Normal appearance. He is obese. He is not ill-appearing, toxic-appearing or diaphoretic.  HENT:     Head: Normocephalic and atraumatic.     Nose: Nose normal.     Mouth/Throat:     Mouth: Mucous membranes are moist.     Pharynx: Oropharynx is clear.  Eyes:     General: No scleral icterus.    Extraocular Movements: Extraocular movements intact.  Cardiovascular:     Rate and Rhythm: Normal rate and regular rhythm.     Heart sounds: Normal heart sounds. No murmur heard.    No friction rub. No gallop.  Pulmonary:     Effort: Pulmonary effort is normal. No respiratory distress.     Breath sounds: Normal breath sounds. No wheezing, rhonchi or rales.  Abdominal:     General: Bowel sounds are normal. There is no distension.     Palpations: Abdomen is soft.     Tenderness: There is no abdominal tenderness. There is no guarding or rebound.  Musculoskeletal:     Cervical back: Neck supple.     Right lower leg: No edema.     Left lower leg: No edema.  Skin:    General: Skin is warm and dry.     Coloration: Skin is not jaundiced or pale.  Neurological:     General: No focal deficit present.     Mental Status: He is alert and oriented to person, place, and time. Mental status is at baseline.  Psychiatric:        Mood and Affect: Mood normal.        Behavior: Behavior normal.        Thought Content: Thought content normal.        Judgment: Judgment normal.      Assessment:  [name]. is a 62 y.o. male  who presents today for Colonoscopy for Screening colonoscopy .  Plan:  Colonoscopy with possible intervention today  Colonoscopy with possible biopsy, control of bleeding, polypectomy, and interventions as necessary has been discussed with the patient/patient representative. Informed consent was obtained from the patient/patient representative after explaining the indication, nature, and risks of the procedure including but not limited to death,  bleeding, perforation, missed neoplasm/lesions, cardiorespiratory compromise, and reaction to medications. Opportunity for questions was given and appropriate answers were provided. Patient/patient representative has verbalized understanding is amenable to undergoing the procedure.   Annamaria Helling, DO  [facility]  Portions of the record may have been created with voice recognition software. Occasional wrong-word or 'sound-a-like' substitutions may have occurred due to the inherent limitations of voice recognition software.  Read the chart carefully and recognize, using context, where substitutions may have occurred.

## 2023-01-13 NOTE — Op Note (Signed)
[facility] Patient Name: Darrell Montes Procedure Date: 01/13/2023 8:19 AM MRN: [id number] Account #: [id number] Date of Birth: 11/16/1961 Admit Type: Outpatient Age: 62 Room: [facility] ENDO ROOM 1 Gender: Male Note Status: Finalized Instrument Name: Colonscope [id number] Procedure:             Colonoscopy Indications:           Screening for colorectal malignant neoplasm Providers:             Annamaria Helling DO, DO Medicines:             Monitored Anesthesia Care Complications:         No immediate complications. Estimated blood loss:                         Minimal. Procedure:             Pre-Anesthesia Assessment:                        - Prior to the procedure, a History and Physical was                         performed, and patient medications and allergies were                         reviewed. The patient is competent. The risks and                         benefits of the procedure and the sedation options and                         risks were discussed with the patient. All questions                         were answered and informed consent was obtained.                         Patient identification and proposed procedure were                         verified by the physician, the nurse, the anesthetist                         and the technician in the endoscopy suite. Mental                         Status Examination: alert and oriented. Airway                         Examination: normal oropharyngeal airway and neck                         mobility. Respiratory Examination: clear to                         auscultation. CV Examination: RRR, no murmurs, no S3                         or S4. Prophylactic Antibiotics: The patient does not  require prophylactic antibiotics. Prior                         Anticoagulants: The patient has taken no anticoagulant                         or antiplatelet agents. ASA Grade  Assessment: II - A                         patient with mild systemic disease. After reviewing                         the risks and benefits, the patient was deemed in                         satisfactory condition to undergo the procedure. The                         anesthesia plan was to use monitored anesthesia care                         (MAC). Immediately prior to administration of                         medications, the patient was re-assessed for adequacy                         to receive sedatives. The heart rate, respiratory                         rate, oxygen saturations, blood pressure, adequacy of                         pulmonary ventilation, and response to care were                         monitored throughout the procedure. The physical                         status of the patient was re-assessed after the                         procedure.                        After obtaining informed consent, the colonoscope was                         passed under direct vision. Throughout the procedure,                         the patient's blood pressure, pulse, and oxygen                         saturations were monitored continuously. The                         Colonoscope was introduced through the anus and  advanced to the the cecum, identified by appendiceal                         orifice and ileocecal valve. The colonoscopy was                         performed without difficulty. The patient tolerated                         the procedure well. The quality of the bowel                         preparation was evaluated using the BBPS [facility] Bowel                         Preparation Scale) with scores of: Right Colon = 3,                         Transverse Colon = 3 and Left Colon = 3 (entire mucosa                         seen well with no residual staining, small fragments                         of stool or opaque liquid). The total BBPS score                          equals 9. The ileocecal valve, appendiceal orifice,                         and rectum were photographed. Findings:      The perianal and digital rectal examinations were normal. Pertinent       negatives include normal sphincter tone.      Two sessile polyps were found in the transverse colon and ascending       colon. The polyps were 1 to 2 mm in size. These polyps were removed with       a jumbo cold forceps. Resection and retrieval were complete. Estimated       blood loss was minimal.      Scattered small-mouthed diverticula were found in the entire colon.       Estimated blood loss: none.      Non-bleeding internal hemorrhoids were found during retroflexion. The       hemorrhoids were Grade I (internal hemorrhoids that do not prolapse).       Estimated blood loss: none.      The exam was otherwise without abnormality on direct and retroflexion       views. Impression:            - Two 1 to 2 mm polyps in the transverse colon and in                         the ascending colon, removed with a jumbo cold                         forceps. Resected and retrieved.                        -  Diverticulosis in the entire examined colon.                        - Non-bleeding internal hemorrhoids.                        - The examination was otherwise normal on direct and                         retroflexion views. Recommendation:        - Patient has a contact number available for                         emergencies. The signs and symptoms of potential                         delayed complications were discussed with the patient.                         Return to normal activities tomorrow. Written                         discharge instructions were provided to the patient.                        - Discharge patient to home.                        - Resume previous diet.                        - Continue present medications.                        - Await pathology  results.                        - Repeat colonoscopy for surveillance based on                         pathology results.                        - Return to referring physician as previously                         scheduled.                        - The findings and recommendations were discussed with                         the patient. Procedure Code(s):     --- Professional ---                        [id number], Colonoscopy, flexible; with biopsy, single or                         multiple Diagnosis Code(s):     --- Professional ---  Z12.11, Encounter for screening for malignant neoplasm                         of colon                        D12.3, Benign neoplasm of transverse colon (hepatic                         flexure or splenic flexure)                        D12.2, Benign neoplasm of ascending colon                        K64.0, First degree hemorrhoids                        K57.30, Diverticulosis of large intestine without                         perforation or abscess without bleeding CPT copyright 2022 American Medical Association. All rights reserved. The codes documented in this report are preliminary and upon coder review may  be revised to meet current compliance requirements. Attending Participation:      I personally performed the entire procedure. Volney American, DO Annamaria Helling DO, DO 01/13/2023 9:13:47 AM This report has been signed electronically. Number of Addenda: 0 Note Initiated On: 01/13/2023 8:19 AM Scope Withdrawal Time: 0 hours 18 minutes 36 seconds  Total Procedure Duration: 0 hours 25 minutes 52 seconds  Estimated Blood Loss:  Estimated blood loss was minimal.      [facility]

## 2023-01-13 NOTE — Anesthesia Preprocedure Evaluation (Signed)
Anesthesia Evaluation  Patient identified by MRN, date of birth, ID band Patient awake    Reviewed: Allergy & Precautions, H&P , NPO status , Patient's Chart, lab work & pertinent test results, reviewed documented beta blocker date and time   History of Anesthesia Complications Negative for: history of anesthetic complications  Airway Mallampati: III  TM Distance: >3 FB Neck ROM: full    Dental  (+) Dental Advidsory Given, Missing, Teeth Intact   Pulmonary neg shortness of breath, sleep apnea , neg COPD, neg recent URI   Pulmonary exam normal breath sounds clear to auscultation       Cardiovascular Exercise Tolerance: Good hypertension, (-) angina (-) Past MI and (-) Cardiac Stents Normal cardiovascular exam(-) dysrhythmias (-) Valvular Problems/Murmurs Rhythm:regular Rate:Normal     Neuro/Psych negative neurological ROS  negative psych ROS   GI/Hepatic Neg liver ROS,GERD  ,,  Endo/Other  negative endocrine ROS    Renal/GU negative Renal ROS  negative genitourinary   Musculoskeletal   Abdominal   Peds  Hematology negative hematology ROS (+)   Anesthesia Other Findings Past Medical History: No date: Avascular necrosis of femur head, left (HCC) No date: Cellulitis of great toe of left foot No date: GERD (gastroesophageal reflux disease) No date: Hx of blood clots No date: Hypertension No date: Sleep apnea   Reproductive/Obstetrics negative OB ROS                             Anesthesia Physical Anesthesia Plan  ASA: 2  Anesthesia Plan: General   Post-op Pain Management:    Induction: Intravenous  PONV Risk Score and Plan: 2 and Propofol infusion and TIVA  Airway Management Planned: Natural Airway and Nasal Cannula  Additional Equipment:   Intra-op Plan:   Post-operative Plan:   Informed Consent: I have reviewed the patients History and Physical, chart, labs and discussed  the procedure including the risks, benefits and alternatives for the proposed anesthesia with the patient or authorized representative who has indicated his/her understanding and acceptance.     Dental Advisory Given  Plan Discussed with: Anesthesiologist, CRNA and Surgeon  Anesthesia Plan Comments:        Anesthesia Quick Evaluation

## 2023-01-14 ENCOUNTER — Encounter: Payer: Self-pay | Admitting: Gastroenterology

## 2023-01-14 LAB — SURGICAL PATHOLOGY

## 2023-01-20 NOTE — Anesthesia Postprocedure Evaluation (Signed)
Anesthesia Post Note  Patient: Keishon Schiffner.  Procedure(s) Performed: COLONOSCOPY WITH PROPOFOL  Patient location during evaluation: Endoscopy Anesthesia Type: General Level of consciousness: awake and alert Pain management: pain level controlled Vital Signs Assessment: post-procedure vital signs reviewed and stable Respiratory status: spontaneous breathing, nonlabored ventilation, respiratory function stable and patient connected to nasal cannula oxygen Cardiovascular status: blood pressure returned to baseline and stable Postop Assessment: no apparent nausea or vomiting Anesthetic complications: no   No notable events documented.   Last Vitals:  Vitals:   01/13/23 0924 01/13/23 0926  BP: (!) 147/91 (!) 154/81  Pulse: 78 67  Resp: 13 15  Temp:    SpO2: 99% 99%    Last Pain:  Vitals:   01/14/23 0730  TempSrc:   PainSc: 0-No pain                 Martha Clan

## 2023-01-24 IMAGING — CR DG FOOT COMPLETE 3+V*L*
1 series · 3 of 3 positions shown · non-contrast
Comparison: None.

CLINICAL DATA: Ulcer at bottom of the great toe with associated
cellulitis.

EXAM:
LEFT FOOT - COMPLETE 3+ VIEW

[Series 1: dg foot complete left · 0.14mm/px · 3 of 3 slices shown]
[im 1/3]
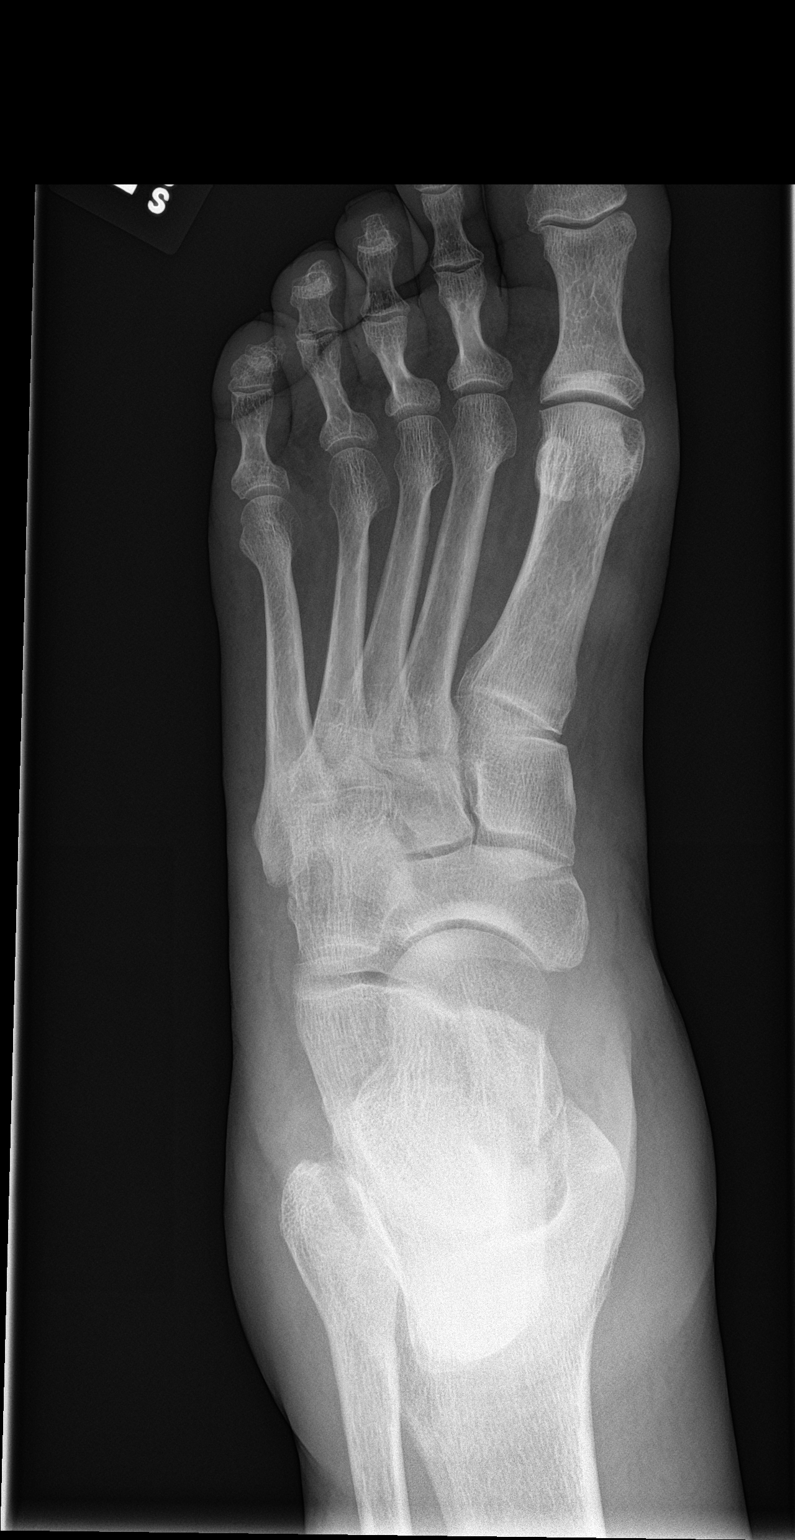
[im 2/3]
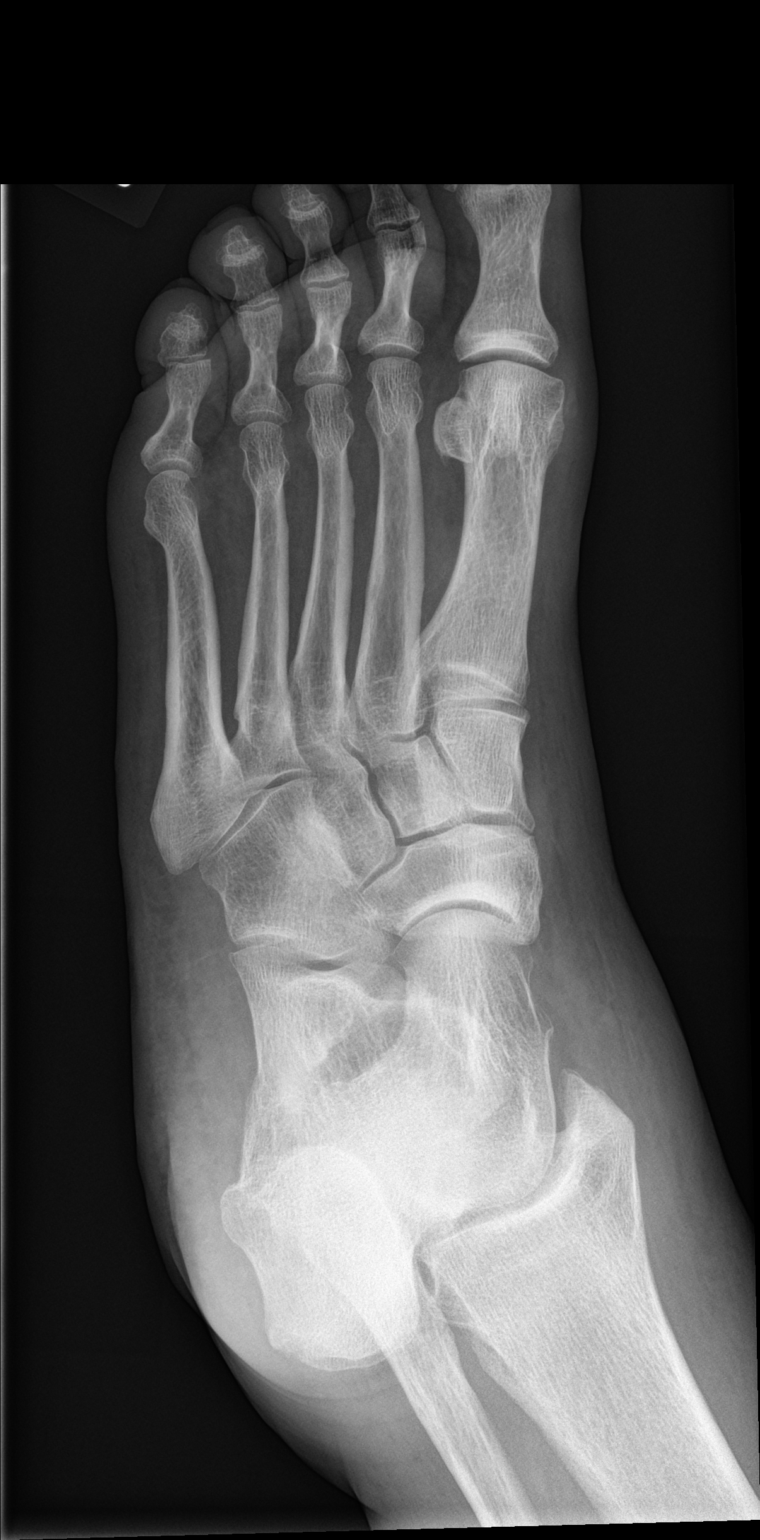
[im 3/3]
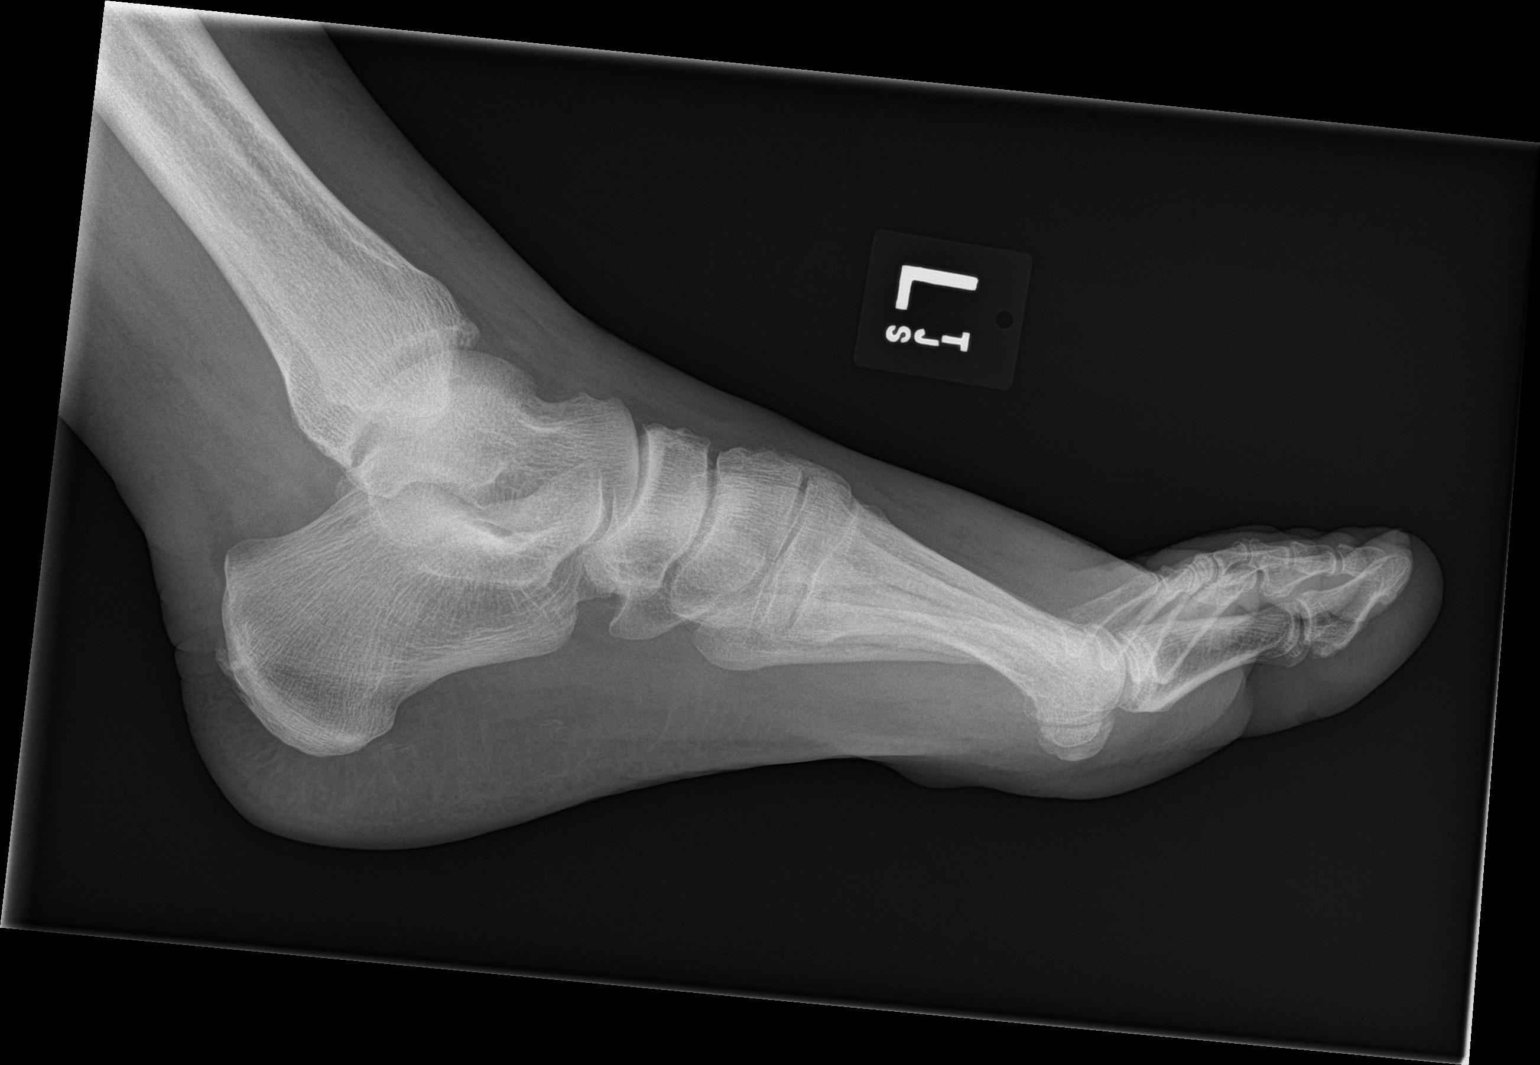

[3 of 3 positions shown; findings below may reference images not displayed]

FINDINGS: Normal bone mineralization. Mild great toe metatarsophalangeal joint
space narrowing and minimal peripheral degenerative spurring. Well
circumscribed lucency measuring 5 mm may represent a degenerative
subchondral cyst at the medial aspect of the metatarsal head.

Mild great toe soft tissue swelling. Tiny soft tissue defect at the
superficial plantar aspect of the great toe at the distance of the
great toe interphalangeal joint, however this does not appear to
extend close to bone. No cortical erosion is seen.

No acute fracture or dislocation.

Minimal chronic enthesopathic change at the Achilles insertion on
the calcaneus.
IMPRESSION: :
IMPRESSION: 1. Minimal great toe metatarsophalangeal joint osteoarthritis.
2. Superficial soft tissue defect at the plantar aspect of the great
toe at the level of the interphalangeal joint, however this does not
appear to extend close to bone and no cortical erosion is seen to
indicate radiographic evidence of acute osteomyelitis.

## 2023-01-25 ENCOUNTER — Encounter: Payer: Self-pay | Admitting: Physician Assistant

## 2023-01-27 ENCOUNTER — Other Ambulatory Visit: Payer: Self-pay | Admitting: Urology

## 2023-01-27 NOTE — Telephone Encounter (Signed)
When should this patient follow-up? He was given a trial of trospium in 10/2022 and keep asking for refills?? Please advise

## 2023-01-28 MED ORDER — TROSPIUM CHLORIDE 20 MG PO TABS: 20.0000 mg | ORAL_TABLET | Freq: Every day | ORAL | 1 refills | Status: DC

## 2023-01-28 NOTE — Telephone Encounter (Signed)
Stoioff, Ronda Fairly, MD  to Me     01/28/23  7:12 AM Okay to refill but would change dose to 1 tablet 1 hour prior to bedtime.  Can send in 90 tabs with 1 refill.  Schedule follow-up appointment in 6 months  Patient notified and appointment made.

## 2023-02-09 ENCOUNTER — Encounter: Payer: Self-pay | Admitting: Physician Assistant

## 2023-02-09 ENCOUNTER — Other Ambulatory Visit: Payer: Self-pay | Admitting: Physician Assistant

## 2023-02-09 MED ORDER — LOSARTAN POTASSIUM 100 MG PO TABS: 100.0000 mg | ORAL_TABLET | Freq: Every day | ORAL | 1 refills | Status: DC

## 2023-02-16 NOTE — Progress Notes (Unsigned)
I,Sha'taria Tyson,acting as a Education administrator for [employer], PA-C.,have documented all relevant documentation on the behalf of Mikey Kirschner, PA-C,as directed by  Mikey Kirschner, PA-C while in the presence of Mikey Kirschner, PA-C.   Complete physical exam   Patient: Darrell Montes.   DOB: 09/20/1961   62 y.o. Male  MRN: [id number] Visit Date: 02/17/2023  Today's healthcare provider: Mikey Kirschner, PA-C   Cc. cpe  Subjective    Darrell Montes. is a 62 y.o. male who presents today for a complete physical exam.  He reports consuming a general and low sodium diet. Gym/ health club routine includes stationary bike and walking on track . He generally feels fairly well. He reports sleeping poorly. He does not have additional problems to discuss today.  He reports an itchy patch on his left lower back and behind his left knee.   Past Medical History:  Diagnosis Date   Avascular necrosis of femur head, left (HCC)    Cellulitis of great toe of left foot    GERD (gastroesophageal reflux disease)    Hx of blood clots    Hypertension    Sleep apnea    Past Surgical History:  Procedure Laterality Date   BACK SURGERY     X2 lower disc    COLONOSCOPY     COLONOSCOPY WITH PROPOFOL N/A 01/13/2023   Procedure: COLONOSCOPY WITH PROPOFOL;  Surgeon: Annamaria Helling, DO;  Location: [facility] ENDOSCOPY;  Service: Gastroenterology;  Laterality: N/A;   KNEE SURGERY Left    X2 scope   TONSILLECTOMY     UPPER GI ENDOSCOPY  07/11/2013   normal duodenum, benign appearing esophageal stricture x2    Social History   Socioeconomic History   Marital status: Single    Spouse name: Not on file   Number of children: 2   Years of education: Not on file   Highest education level: Not on file  Occupational History   Occupation: tech support  Tobacco Use   Smoking status: Never   Smokeless tobacco: Never  Vaping Use   Vaping Use: Never used  Substance and Sexual Activity   Alcohol use: Yes     Alcohol/week: 8.0 standard drinks of alcohol    Types: 8 Standard drinks or equivalent per week    Comment: 0-2 daily   Drug use: No   Sexual activity: Not on file  Other Topics Concern   Not on file  Social History Narrative   Not on file   Social Determinants of Health   Financial Resource Strain: Low Risk  (03/08/2018)   Overall Financial Resource Strain (CARDIA)    Difficulty of Paying Living Expenses: Not very hard  Food Insecurity: No Food Insecurity (03/08/2018)   Hunger Vital Sign    Worried About Running Out of Food in the Last Year: Never true    Ran Out of Food in the Last Year: Never true  Transportation Needs: Not on file  Physical Activity: Not on file  Stress: Not on file  Social Connections: Not on file  Intimate Partner Violence: Not on file   Family Status  Relation Name Status   Mother  Deceased   Father  Deceased   Daughter  Alive   Daughter  Alive   Family History  Problem Relation Age of Onset   COPD Mother    Bladder Cancer Mother    Hypertension Father    COPD Father    Sleep apnea Father  Allergies  Allergen Reactions   Ace Inhibitors Cough    Patient Care Team: Mikey Kirschner, PA-C as PCP - General (Physician Assistant)   Medications: Outpatient Medications Prior to Visit  Medication Sig   atorvastatin (LIPITOR) 10 MG tablet TAKE 1 TABLET BY MOUTH ONCE EVERY EVENING   etodolac (LODINE) 500 MG tablet Take 500 mg by mouth 2 (two) times daily.   loratadine (CLARITIN) 10 MG tablet Take 1 tablet (10 mg total) by mouth daily. (Patient taking differently: Take 10 mg by mouth daily. Takes as needed)   losartan (COZAAR) 100 MG tablet Take 1 tablet (100 mg total) by mouth daily.   montelukast (SINGULAIR) 10 MG tablet Take 10 mg by mouth at bedtime.   Multiple Vitamin tablet Take by mouth.   pantoprazole (PROTONIX) 40 MG tablet Take 1 tablet (40 mg total) by mouth daily.   psyllium (METAMUCIL) 58.6 % powder Take 1 packet by mouth 3 (three)  times daily.   tamsulosin (FLOMAX) 0.4 MG CAPS capsule Take 1 capsule (0.4 mg total) by mouth daily.   traZODone (DESYREL) 100 MG tablet Take 100 mg by mouth at bedtime.   trospium (SANCTURA) 20 MG tablet Take 1 tablet (20 mg total) by mouth at bedtime.   Vitamin D, Ergocalciferol, (DRISDOL) 1.25 MG (50000 UNIT) CAPS capsule Take 50,000 Units by mouth every 7 (seven) days.   gabapentin (NEURONTIN) 300 MG capsule Take by mouth.   [DISCONTINUED] amLODipine (NORVASC) 5 MG tablet Take 2 tablets (10 mg total) by mouth daily. (Patient not taking: Reported on 02/17/2023)   [DISCONTINUED] clomiPHENE (CLOMID) 50 MG tablet Take 25 mg by mouth daily. (Patient not taking: Reported on 02/17/2023)   No facility-administered medications prior to visit.    Review of Systems  Constitutional:  Positive for fatigue.  Cardiovascular:  Positive for leg swelling.  Genitourinary:  Positive for frequency.  Neurological:  Positive for numbness.    Objective    BP (!) 188/94 (BP Location: Right Arm, Patient Position: Sitting, Cuff Size: Large)   Pulse 66   Ht 6\' 2"  (1.88 m)   Wt 274 lb 14.4 oz (124.7 kg)   SpO2 97%   BMI 35.30 kg/m   Physical Exam Constitutional:      General: He is awake.     Appearance: He is well-developed.  HENT:     Head: Normocephalic.     Right Ear: Tympanic membrane, ear canal and external ear normal.     Left Ear: Tympanic membrane, ear canal and external ear normal.     Nose: Nose normal. No congestion or rhinorrhea.     Mouth/Throat:     Mouth: Mucous membranes are moist.     Pharynx: No oropharyngeal exudate or posterior oropharyngeal erythema.  Eyes:     Pupils: Pupils are equal, round, and reactive to light.  Cardiovascular:     Rate and Rhythm: Normal rate and regular rhythm.     Heart sounds: Normal heart sounds.  Pulmonary:     Effort: Pulmonary effort is normal.     Breath sounds: Normal breath sounds.  Abdominal:     General: There is no distension.      Palpations: Abdomen is soft.     Tenderness: There is no abdominal tenderness. There is no guarding.  Musculoskeletal:     Cervical back: Normal range of motion.     Right lower leg: No edema.     Left lower leg: No edema.  Lymphadenopathy:     Cervical: No  cervical adenopathy.  Skin:    General: Skin is warm.     Comments: Excoriation marks L lumbar  Neurological:     Mental Status: He is alert and oriented to person, place, and time.  Psychiatric:        Attention and Perception: Attention normal.        Mood and Affect: Mood normal.        Speech: Speech normal.        Behavior: Behavior normal. Behavior is cooperative.     Last depression screening scores    02/17/2023    9:02 AM 12/16/2022    9:46 AM 08/18/2022    8:08 AM  PHQ 2/9 Scores  PHQ - 2 Score 0 1 0  PHQ- 9 Score 2 7 3    Last fall risk screening    02/17/2023    9:02 AM  Fall Risk   Falls in the past year? 0  Number falls in past yr: 0  Injury with Fall? 0  Risk for fall due to : No Fall Risks  Follow up Falls evaluation completed   Last Audit-C alcohol use screening    02/17/2023    9:02 AM  Alcohol Use Disorder Test (AUDIT)  1. How often do you have a drink containing alcohol? 3  2. How many drinks containing alcohol do you have on a typical day when you are drinking? 1  3. How often do you have six or more drinks on one occasion? 1  AUDIT-C Score 5   A score of 3 or more in women, and 4 or more in men indicates increased risk for alcohol abuse, EXCEPT if all of the points are from question 1   No results found for any visits on 02/17/23.  Assessment & Plan    Routine Health Maintenance and Physical Exam  Exercise Activities and Dietary recommendations --balanced diet high in fiber and protein, low in sugars, carbs, fats. --physical activity/exercise 30 minutes 3-5 times a week    Immunization History  Administered Date(s) Administered   COVID-19, mRNA, vaccine(Comirnaty)12 years and older  12/16/2022   Influenza Inj Mdck Quad Pf 08/18/2021   Influenza Split 12/10/2010, 09/29/2011, 11/02/2012, 11/13/2013   Influenza,inj,Quad PF,6+ Mos 11/13/2013, 10/01/2015, 08/24/2016, 09/06/2017, 09/13/2018, 08/18/2022   Moderna Covid-19 Vaccine Bivalent Booster 50yrs & up 09/08/2021   Moderna Sars-Covid-2 Vaccination 02/16/2020, 03/15/2020, 10/30/2020, 05/28/2021   Tdap 04/28/2011, 04/22/2021   Zoster Recombinat (Shingrix) 05/28/2021, 09/08/2021    Health Maintenance  Topic Date Due   COVID-19 Vaccine (7 - 2023-24 season) 02/10/2023   HIV Screening  08/19/2023 (Originally 07/20/1976)   DTaP/Tdap/Td (3 - Td or Tdap) 04/23/2031   COLONOSCOPY (Pts 45-73yrs Insurance coverage will need to be confirmed)  01/13/2033   INFLUENZA VACCINE  Completed   Hepatitis C Screening  Completed   Zoster Vaccines- Shingrix  Completed   HPV VACCINES  Aged Out    Discussed health benefits of physical activity, and encouraged him to engage in regular exercise appropriate for his age and condition.  Problem List Items Addressed This Visit       Cardiovascular and Mediastinum   Essential (primary) hypertension    Pt managed on losartan 100 mg  Was supposed to be taking amlodipine 10 mg ; pt stated he ran out and just stopped Refilled.  Reviewed labs F/u 4 weeks      Relevant Medications   amLODipine (NORVASC) 10 MG tablet     Other   HLD (hyperlipidemia)  Managed with lipitor 10 LDL is < 100   Discussed given risk factors, weight, htn, prediab, hld, to do a CT calcium score. Pt will consider      Relevant Medications   amLODipine (NORVASC) 10 MG tablet   Prediabetes    Last checked, prediab. A1c 5.9%      Other Visit Diagnoses     Annual physical exam    -  Primary   Rash       Relevant Medications   triamcinolone ointment (KENALOG) 0.5 %      Rash -itchy patches, excoriation marks seen, rx triamcinalone prn   Return in about 4 weeks (around 03/17/2023) for hypertension.      I, Mikey Kirschner, PA-C have reviewed all documentation for this visit. The documentation on  02/17/23  for the exam, diagnosis, procedures, and orders are all accurate and complete.  Mikey Kirschner, PA-C [facility] [address] [place], [place], [address] Office: [phone] Fax: [place]

## 2023-02-17 ENCOUNTER — Encounter: Payer: Self-pay | Admitting: Physician Assistant

## 2023-02-17 ENCOUNTER — Ambulatory Visit (INDEPENDENT_AMBULATORY_CARE_PROVIDER_SITE_OTHER): Payer: 59 | Admitting: Physician Assistant

## 2023-02-17 VITALS — BP 188/94 | HR 66 | Ht 74.0 in | Wt 274.9 lb

## 2023-02-17 DIAGNOSIS — Z0000 Encounter for general adult medical examination without abnormal findings: Secondary | ICD-10-CM | POA: Diagnosis not present

## 2023-02-17 DIAGNOSIS — I10 Essential (primary) hypertension: Secondary | ICD-10-CM

## 2023-02-17 DIAGNOSIS — R21 Rash and other nonspecific skin eruption: Secondary | ICD-10-CM | POA: Diagnosis not present

## 2023-02-17 DIAGNOSIS — R7303 Prediabetes: Secondary | ICD-10-CM | POA: Diagnosis not present

## 2023-02-17 DIAGNOSIS — E785 Hyperlipidemia, unspecified: Secondary | ICD-10-CM

## 2023-02-17 MED ORDER — TRIAMCINOLONE ACETONIDE 0.5 % EX OINT: 1.0000 | TOPICAL_OINTMENT | Freq: Two times a day (BID) | CUTANEOUS | 3 refills | Status: AC

## 2023-02-17 MED ORDER — AMLODIPINE BESYLATE 10 MG PO TABS: 10.0000 mg | ORAL_TABLET | Freq: Every day | ORAL | 3 refills | Status: AC

## 2023-02-17 NOTE — Assessment & Plan Note (Signed)
Pt managed on losartan 100 mg  Was supposed to be taking amlodipine 10 mg ; pt stated he ran out and just stopped Refilled.  Reviewed labs F/u 4 weeks

## 2023-02-17 NOTE — Assessment & Plan Note (Addendum)
Managed with lipitor 10 LDL is < 100   Discussed given risk factors, weight, htn, prediab, hld, to do a CT calcium score. Pt will consider

## 2023-02-17 NOTE — Assessment & Plan Note (Signed)
Last checked, prediab. A1c 5.9%

## 2023-03-07 DIAGNOSIS — E669 Obesity, unspecified: Secondary | ICD-10-CM | POA: Diagnosis not present

## 2023-03-07 DIAGNOSIS — G8929 Other chronic pain: Secondary | ICD-10-CM | POA: Diagnosis not present

## 2023-03-07 DIAGNOSIS — M1712 Unilateral primary osteoarthritis, left knee: Secondary | ICD-10-CM | POA: Diagnosis not present

## 2023-03-23 ENCOUNTER — Encounter: Payer: Self-pay | Admitting: Physician Assistant

## 2023-03-23 ENCOUNTER — Ambulatory Visit (INDEPENDENT_AMBULATORY_CARE_PROVIDER_SITE_OTHER): Payer: 59 | Admitting: Physician Assistant

## 2023-03-23 VITALS — BP 139/84 | HR 68 | Wt 272.7 lb

## 2023-03-23 DIAGNOSIS — R351 Nocturia: Secondary | ICD-10-CM | POA: Diagnosis not present

## 2023-03-23 DIAGNOSIS — I10 Essential (primary) hypertension: Secondary | ICD-10-CM

## 2023-03-23 NOTE — Assessment & Plan Note (Addendum)
Managed on losartan 100 mg and amlodipine 10 mg.  H/o acei cough.  Now well controlled

## 2023-03-23 NOTE — Progress Notes (Signed)
I,Sha'taria Tyson,acting as a Neurosurgeon for [employer], PA-C.,have documented all relevant documentation on the behalf of Alfredia Ferguson, PA-C,as directed by  Alfredia Ferguson, PA-C while in the presence of Alfredia Ferguson, PA-C.   Established patient visit   Patient: Darrell Montes.   DOB: 03/04/1961   62 y.o. Male  MRN: [id number] Visit Date: 03/23/2023  Today's healthcare provider: Alfredia Ferguson, PA-C   Cc. Htn f/u  Subjective    HPI  Nocturia -Still present despite meds. Multiple times an evening.  Hypertension, follow-up  BP Readings from Last 3 Encounters:  03/23/23 139/84  02/17/23 (!) 188/94  01/13/23 (!) 154/81   Wt Readings from Last 3 Encounters:  03/23/23 272 lb 11.2 oz (123.7 kg)  02/17/23 274 lb 14.4 oz (124.7 kg)  01/13/23 275 lb (124.7 kg)     He was last seen for hypertension 1 months ago.  BP at that visit was 188/94. Management since that visit includes managed on losartan 100 mg. Was supposed to be taking amlodipine 10 mg ; pt stated he ran out and just stopped Refilled.  He reports excellent compliance with treatment. He is not having side effects.  He is following a Low Sodium diet. He is exercising. He does not smoke.  Use of agents associated with hypertension: none.   Symptoms: No chest pain No chest pressure  No palpitations No syncope  No dyspnea No orthopnea  No paroxysmal nocturnal dyspnea Yes lower extremity edema   Pertinent labs Lab Results  Component Value Date   CHOL 164 08/19/2022   HDL 34 (L) 08/19/2022   LDLCALC 89 08/19/2022   TRIG 241 (H) 08/19/2022   CHOLHDL 4.8 08/19/2022   Lab Results  Component Value Date   NA 145 (H) 12/16/2022   K 4.4 12/16/2022   CREATININE 0.74 (L) 12/16/2022   EGFR 103 12/16/2022   GLUCOSE 104 (H) 12/16/2022   TSH 2.510 12/16/2022     The ASCVD Risk score (Arnett DK, et al., 2019) failed to calculate for the following reasons:   Unable to determine if patient is Non-Hispanic African  American  ---------------------------------------------------------------------------------------------------   Medications: Outpatient Medications Prior to Visit  Medication Sig   amLODipine (NORVASC) 10 MG tablet Take 1 tablet (10 mg total) by mouth daily.   atorvastatin (LIPITOR) 10 MG tablet TAKE 1 TABLET BY MOUTH ONCE EVERY EVENING   etodolac (LODINE) 500 MG tablet Take 500 mg by mouth 2 (two) times daily.   loratadine (CLARITIN) 10 MG tablet Take 1 tablet (10 mg total) by mouth daily. (Patient taking differently: Take 10 mg by mouth daily. Takes as needed)   losartan (COZAAR) 100 MG tablet Take 1 tablet (100 mg total) by mouth daily.   montelukast (SINGULAIR) 10 MG tablet Take 10 mg by mouth at bedtime.   Multiple Vitamin tablet Take by mouth.   pantoprazole (PROTONIX) 40 MG tablet Take 1 tablet (40 mg total) by mouth daily.   psyllium (METAMUCIL) 58.6 % powder Take 1 packet by mouth 3 (three) times daily.   tamsulosin (FLOMAX) 0.4 MG CAPS capsule Take 1 capsule (0.4 mg total) by mouth daily.   traZODone (DESYREL) 100 MG tablet Take 100 mg by mouth at bedtime.   triamcinolone ointment (KENALOG) 0.5 % Apply 1 Application topically 2 (two) times daily.   trospium (SANCTURA) 20 MG tablet Take 1 tablet (20 mg total) by mouth at bedtime.   Vitamin D, Ergocalciferol, (DRISDOL) 1.25 MG (50000 UNIT) CAPS capsule Take 50,000  Units by mouth every 7 (seven) days.   gabapentin (NEURONTIN) 300 MG capsule Take by mouth.   No facility-administered medications prior to visit.    Review of Systems  Constitutional:  Negative for fatigue and fever.  Respiratory:  Negative for cough and shortness of breath.   Cardiovascular:  Negative for chest pain, palpitations and leg swelling.  Neurological:  Negative for dizziness and headaches.      Objective    BP 139/84 (BP Location: Right Arm, Patient Position: Sitting, Cuff Size: Large)   Pulse 68   Wt 272 lb 11.2 oz (123.7 kg)   SpO2 97%   BMI  35.01 kg/m   Physical Exam Vitals reviewed.  Constitutional:      Appearance: He is not ill-appearing.  HENT:     Head: Normocephalic.  Eyes:     Conjunctiva/sclera: Conjunctivae normal.  Cardiovascular:     Rate and Rhythm: Normal rate.  Pulmonary:     Effort: Pulmonary effort is normal. No respiratory distress.  Neurological:     General: No focal deficit present.     Mental Status: He is alert and oriented to person, place, and time.  Psychiatric:        Mood and Affect: Mood normal.        Behavior: Behavior normal.      No results found for any visits on 03/23/23.  Assessment & Plan     Problem List Items Addressed This Visit       Cardiovascular and Mediastinum   Essential (primary) hypertension - Primary    Managed on losartan 100 mg and amlodipine 10 mg.  H/o acei cough.  Now well controlled        Other Visit Diagnoses     Nocturia           2. Nocturia Ref back to uro  Return in about 5 months (around 08/23/2023) for chronic conditions.      I, Alfredia Ferguson, PA-C have reviewed all documentation for this visit. The documentation on  03/23/23 for the exam, diagnosis, procedures, and orders are all accurate and complete.  Alfredia Ferguson, PA-C [facility] [address] [place], [place], [address] Office: [phone] Fax: [phone]   [facility]

## 2023-05-11 DIAGNOSIS — M1712 Unilateral primary osteoarthritis, left knee: Secondary | ICD-10-CM | POA: Diagnosis not present

## 2023-05-11 DIAGNOSIS — M7062 Trochanteric bursitis, left hip: Secondary | ICD-10-CM | POA: Diagnosis not present

## 2023-05-17 ENCOUNTER — Ambulatory Visit (INDEPENDENT_AMBULATORY_CARE_PROVIDER_SITE_OTHER): Payer: 59 | Admitting: Physician Assistant

## 2023-05-17 ENCOUNTER — Encounter: Payer: Self-pay | Admitting: Physician Assistant

## 2023-05-17 VITALS — BP 128/74 | HR 52 | Temp 98.7°F | Wt 271.0 lb

## 2023-05-17 DIAGNOSIS — J0101 Acute recurrent maxillary sinusitis: Secondary | ICD-10-CM

## 2023-05-17 MED ORDER — FLUTICASONE PROPIONATE 50 MCG/ACT NA SUSP: NASAL | 2 refills | Status: DC

## 2023-05-17 MED ORDER — MONTELUKAST SODIUM 10 MG PO TABS: 10.0000 mg | ORAL_TABLET | Freq: Every day | ORAL | 0 refills | Status: DC

## 2023-05-17 NOTE — Progress Notes (Signed)
Established patient visit  Patient: Darrell Montes.   DOB: [birth year]   62 y.o. Male  MRN: [id number] Visit Date: 05/17/2023  Today's healthcare provider: Debera Lat, PA-C   Chief Complaint  Patient presents with   Sinus Problem   Subjective    HPI  Patient is a 62 year old male who presents for evaluation of possible sinus infections.  Symptoms began 4 days ago with congestion, drainage and ear pain.  Symptoms got much worse today.  He has been suing Mucinex DM and Benadryl with no relief.      05/17/2023    9:04 AM 03/23/2023    9:16 AM 02/17/2023    9:02 AM  Depression screen PHQ 2/9  Decreased Interest 0 0 0  Down, Depressed, Hopeless 0 0 0  PHQ - 2 Score 0 0 0  Altered sleeping 1 0 1  Tired, decreased energy 1 1 1   Change in appetite 0 0 0  Feeling bad or failure about yourself  0 0 0  Trouble concentrating 0 0 0  Moving slowly or fidgety/restless 0 0 0  Suicidal thoughts 0 0 0  PHQ-9 Score 2 1 2   Difficult doing work/chores Not difficult at all Not difficult at all Not difficult at all   Medications: Outpatient Medications Prior to Visit  Medication Sig   amLODipine (NORVASC) 10 MG tablet Take 1 tablet (10 mg total) by mouth daily.   atorvastatin (LIPITOR) 10 MG tablet TAKE 1 TABLET BY MOUTH ONCE EVERY EVENING   etodolac (LODINE) 500 MG tablet Take 500 mg by mouth 2 (two) times daily.   loratadine (CLARITIN) 10 MG tablet Take 1 tablet (10 mg total) by mouth daily. (Patient taking differently: Take 10 mg by mouth daily. Takes as needed)   losartan (COZAAR) 100 MG tablet Take 1 tablet (100 mg total) by mouth daily.   Multiple Vitamin tablet Take by mouth.   pantoprazole (PROTONIX) 40 MG tablet Take 1 tablet (40 mg total) by mouth daily.   psyllium (METAMUCIL) 58.6 % powder Take 1 packet by mouth 3 (three) times daily.   tamsulosin (FLOMAX) 0.4 MG CAPS capsule Take 1 capsule (0.4 mg total) by mouth daily.   traZODone (DESYREL) 100 MG tablet Take 100 mg by mouth at  bedtime.   triamcinolone ointment (KENALOG) 0.5 % Apply 1 Application topically 2 (two) times daily.   trospium (SANCTURA) 20 MG tablet Take 1 tablet (20 mg total) by mouth at bedtime.   Vitamin D, Ergocalciferol, (DRISDOL) 1.25 MG (50000 UNIT) CAPS capsule Take 50,000 Units by mouth every 7 (seven) days.   [DISCONTINUED] montelukast (SINGULAIR) 10 MG tablet Take 10 mg by mouth at bedtime.   gabapentin (NEURONTIN) 300 MG capsule Take by mouth.   No facility-administered medications prior to visit.    Review of Systems  Constitutional:  Negative for chills, diaphoresis, fatigue and fever.  HENT:  Positive for congestion, ear pain, postnasal drip, rhinorrhea and sinus pressure. Negative for ear discharge, facial swelling, hearing loss, nosebleeds, sinus pain, sneezing, sore throat, tinnitus, trouble swallowing and voice change.   Eyes:  Positive for discharge (watery). Negative for photophobia, pain, redness, itching and visual disturbance.  Respiratory:  Negative for cough, shortness of breath and wheezing.   Cardiovascular:  Negative for chest pain, palpitations and leg swelling.  Gastrointestinal:  Negative for abdominal pain, constipation, diarrhea and nausea.  Musculoskeletal:  Negative for myalgias.  Neurological:  Negative for dizziness, light-headedness and headaches.   Except see HPI  Objective    BP 128/74 (BP Location: Right Arm, Patient Position: Sitting, Cuff Size: Large)   Pulse (!) 52   Temp 98.7 F (37.1 C) (Oral)   Wt 271 lb (122.9 kg)   SpO2 98%   BMI 34.79 kg/m    Physical Exam Vitals reviewed.  Constitutional:      General: He is not in acute distress.    Appearance: Normal appearance. He is not diaphoretic.  HENT:     Head: Normocephalic and atraumatic.     Right Ear: Ear canal and external ear normal.     Left Ear: Ear canal and external ear normal.     Ears:     Comments: Fluids behind TM    Nose: Congestion and rhinorrhea present.      Mouth/Throat:     Pharynx: No posterior oropharyngeal erythema.     Comments: Postnasal drainage Eyes:     General: No scleral icterus.       Right eye: Discharge present.        Left eye: Discharge present.    Extraocular Movements: Extraocular movements intact.     Conjunctiva/sclera: Conjunctivae normal.     Pupils: Pupils are equal, round, and reactive to light.  Cardiovascular:     Rate and Rhythm: Normal rate and regular rhythm.     Pulses: Normal pulses.     Heart sounds: Normal heart sounds. No murmur heard. Pulmonary:     Effort: Pulmonary effort is normal. No respiratory distress.     Breath sounds: Normal breath sounds. No wheezing or rhonchi.  Musculoskeletal:     Cervical back: Neck supple.     Right lower leg: No edema.     Left lower leg: No edema.  Lymphadenopathy:     Cervical: No cervical adenopathy.  Skin:    General: Skin is warm and dry.     Findings: No rash.  Neurological:     Mental Status: He is alert and oriented to person, place, and time. Mental status is at baseline.  Psychiatric:        Mood and Affect: Mood normal.        Behavior: Behavior normal.      No results found for any visits on 05/17/23.  Assessment & Plan    1. Acute recurrent maxillary sinusitis Seasonal In the setting of seasonal allergies and GERD Advised symptomatic treatment: increase fluids.  Rest.  Saline nasal spray.  Probiotic.  Mucinex as directed.  Humidifier in bedroom. Flonase per orders.  Call or return to clinic if symptoms are not improving. - Use nasal saline rinses before nose sprays such as with Neilmed Sinus Rinse bottle.  Use distilled water.   - Use Flonase 2 sprays each nostril daily. Aim upward and outward. - Use Zyrtec 10 mg daily.  And montelukast at bedtime - montelukast (SINGULAIR) 10 MG tablet; Take 1 tablet (10 mg total) by mouth at bedtime.  Dispense: 30 tablet; Refill: 0 - fluticasone (FLONASE) 50 MCG/ACT nasal spray; Initial, 2 sprays/nostril  daily or 1 spray/nostril twice daily, MAX 2 sprays/nostril/day (200 mcg/day  Dispense: 11.1 mL; Refill: 2   No follow-ups on file.     The patient was advised to call back or seek an in-person evaluation if the symptoms worsen or if the condition fails to improve as anticipated.  I discussed the assessment and treatment plan with the patient. The patient was provided an opportunity to ask questions and all were answered. The patient agreed with the  plan and demonstrated an understanding of the instructions.  I, Debera Lat, PA-C have reviewed all documentation for this visit. The documentation on  05/17/23 for the exam, diagnosis, procedures, and orders are all accurate and complete.  Debera Lat, [facility], MMS [facility] [phone] (phone) [phone] (fax)   [facility]

## 2023-05-22 ENCOUNTER — Encounter: Payer: Self-pay | Admitting: Physician Assistant

## 2023-05-22 DIAGNOSIS — J0101 Acute recurrent maxillary sinusitis: Secondary | ICD-10-CM

## 2023-05-23 MED ORDER — AZITHROMYCIN 250 MG PO TABS: ORAL_TABLET | ORAL | 0 refills | Status: AC

## 2023-06-10 ENCOUNTER — Other Ambulatory Visit: Payer: Self-pay | Admitting: Physician Assistant

## 2023-06-10 DIAGNOSIS — J0101 Acute recurrent maxillary sinusitis: Secondary | ICD-10-CM

## 2023-06-10 NOTE — Telephone Encounter (Signed)
Requested Prescriptions  Pending Prescriptions Disp Refills   montelukast (SINGULAIR) 10 MG tablet [Pharmacy Med Name: MONTELUKAST SOD 10 MG TABLET] 90 tablet 2    Sig: TAKE 1 TABLET BY MOUTH EVERYDAY AT BEDTIME     Pulmonology:  Leukotriene Inhibitors Passed - 06/10/2023  2:16 AM      Passed - Valid encounter within last 12 months    Recent Outpatient Visits           3 weeks ago Acute recurrent maxillary sinusitis   [facility] Chalfant, [place], PA-C   2 months ago Essential (primary) hypertension   [place] [facility] Alfredia Ferguson, PA-C   3 months ago Annual physical exam   [facility] Alfredia Ferguson, PA-C   5 months ago Essential (primary) hypertension   [place] [facility] Ok Edwards, [place], PA-C   9 months ago Essential (primary) hypertension   [place] [facility] Alfredia Ferguson, PA-C       Future Appointments             In 2 months Stoioff, Verna Czech, MD [facility] [place]   In 2 months Ok Edwards, Lou Cal [facility]

## 2023-06-28 ENCOUNTER — Other Ambulatory Visit: Payer: Self-pay | Admitting: Physician Assistant

## 2023-06-28 DIAGNOSIS — E785 Hyperlipidemia, unspecified: Secondary | ICD-10-CM

## 2023-06-29 NOTE — Telephone Encounter (Signed)
Requested Prescriptions  Pending Prescriptions Disp Refills   atorvastatin (LIPITOR) 10 MG tablet [Pharmacy Med Name: ATORVASTATIN 10 MG TABLET] 90 tablet 0    Sig: TAKE 1 TABLET BY MOUTH EVERY EVENING     Cardiovascular:  Antilipid - Statins Failed - 06/28/2023  7:23 PM      Failed - Lipid Panel in normal range within the last 12 months    Cholesterol, Total  Date Value Ref Range Status  08/19/2022 164 100 - 199 mg/dL Final   LDL Chol Calc (NIH)  Date Value Ref Range Status  08/19/2022 89 0 - 99 mg/dL Final   HDL  Date Value Ref Range Status  08/19/2022 34 (L) >39 mg/dL Final   Triglycerides  Date Value Ref Range Status  08/19/2022 241 (H) 0 - 149 mg/dL Final         Passed - Patient is not pregnant      Passed - Valid encounter within last 12 months    Recent Outpatient Visits           1 month ago Acute recurrent maxillary sinusitis   [facility] [place], [place], PA-C   3 months ago Essential (primary) hypertension   [place] [facility] Alfredia Ferguson, PA-C   4 months ago Annual physical exam   [facility] Alfredia Ferguson, PA-C   6 months ago Essential (primary) hypertension   [place] [facility] [place], [place], PA-C   10 months ago Essential (primary) hypertension   [facility] [facility] Alfredia Ferguson, PA-C       Future Appointments             In 1 month Stoioff, Verna Czech, MD [facility] [place]   In 1 month Pardue, Monico Blitz, DO [facility]

## 2023-07-11 ENCOUNTER — Other Ambulatory Visit: Payer: Self-pay | Admitting: Urology

## 2023-07-20 DIAGNOSIS — Z86010 Personal history of colonic polyps: Secondary | ICD-10-CM | POA: Diagnosis not present

## 2023-07-20 DIAGNOSIS — K219 Gastro-esophageal reflux disease without esophagitis: Secondary | ICD-10-CM | POA: Diagnosis not present

## 2023-07-20 DIAGNOSIS — K222 Esophageal obstruction: Secondary | ICD-10-CM | POA: Diagnosis not present

## 2023-07-20 DIAGNOSIS — Z8719 Personal history of other diseases of the digestive system: Secondary | ICD-10-CM | POA: Diagnosis not present

## 2023-07-20 DIAGNOSIS — R1319 Other dysphagia: Secondary | ICD-10-CM | POA: Diagnosis not present

## 2023-07-29 ENCOUNTER — Ambulatory Visit: Payer: 59 | Admitting: Anesthesiology

## 2023-07-29 ENCOUNTER — Ambulatory Visit
Admission: RE | Admit: 2023-07-29 | Discharge: 2023-07-29 | Disposition: A | Discharge status: Home / Self Care | Payer: 59 | Attending: Gastroenterology | Admitting: Gastroenterology

## 2023-07-29 ENCOUNTER — Encounter: Payer: Self-pay | Admitting: Gastroenterology

## 2023-07-29 ENCOUNTER — Encounter
Admission: RE | Disposition: A | Discharge status: Home / Self Care | Payer: Self-pay | Source: Home / Self Care | Attending: Gastroenterology

## 2023-07-29 DIAGNOSIS — G4730 Sleep apnea, unspecified: Secondary | ICD-10-CM | POA: Insufficient documentation

## 2023-07-29 DIAGNOSIS — R1310 Dysphagia, unspecified: Secondary | ICD-10-CM | POA: Insufficient documentation

## 2023-07-29 DIAGNOSIS — K449 Diaphragmatic hernia without obstruction or gangrene: Secondary | ICD-10-CM | POA: Insufficient documentation

## 2023-07-29 DIAGNOSIS — K317 Polyp of stomach and duodenum: Secondary | ICD-10-CM | POA: Insufficient documentation

## 2023-07-29 DIAGNOSIS — K222 Esophageal obstruction: Secondary | ICD-10-CM | POA: Diagnosis not present

## 2023-07-29 DIAGNOSIS — K219 Gastro-esophageal reflux disease without esophagitis: Secondary | ICD-10-CM | POA: Insufficient documentation

## 2023-07-29 DIAGNOSIS — I10 Essential (primary) hypertension: Secondary | ICD-10-CM | POA: Insufficient documentation

## 2023-07-29 DIAGNOSIS — Z8719 Personal history of other diseases of the digestive system: Secondary | ICD-10-CM | POA: Diagnosis not present

## 2023-07-29 DIAGNOSIS — K3189 Other diseases of stomach and duodenum: Secondary | ICD-10-CM | POA: Diagnosis not present

## 2023-07-29 DIAGNOSIS — R1319 Other dysphagia: Secondary | ICD-10-CM | POA: Diagnosis not present

## 2023-07-29 HISTORY — PX: BIOPSY: SHX5522

## 2023-07-29 HISTORY — PX: ESOPHAGOGASTRODUODENOSCOPY (EGD) WITH PROPOFOL: SHX5813

## 2023-07-29 SURGERY — ESOPHAGOGASTRODUODENOSCOPY (EGD) WITH PROPOFOL
Anesthesia: General

## 2023-07-29 MED ORDER — LIDOCAINE HCL (CARDIAC) PF 100 MG/5ML IV SOSY
PREFILLED_SYRINGE | INTRAVENOUS | Status: DC | PRN
  Administered 2023-07-29: 50 mg via INTRAVENOUS

## 2023-07-29 MED ORDER — SODIUM CHLORIDE 0.9 % IV SOLN: INTRAVENOUS | Status: DC

## 2023-07-29 MED ORDER — DEXMEDETOMIDINE HCL IN NACL 80 MCG/20ML IV SOLN
INTRAVENOUS | Status: DC | PRN
Start: 2023-07-29 — End: 2023-07-29
  Administered 2023-07-29 (×3): 8 ug via INTRAVENOUS

## 2023-07-29 MED ORDER — PROPOFOL 10 MG/ML IV BOLUS
INTRAVENOUS | Status: DC | PRN
  Administered 2023-07-29: 30 mg via INTRAVENOUS
  Administered 2023-07-29 (×3): 20 mg via INTRAVENOUS
  Administered 2023-07-29: 100 mg via INTRAVENOUS
  Administered 2023-07-29: 20 mg via INTRAVENOUS
  Administered 2023-07-29 (×2): 30 mg via INTRAVENOUS

## 2023-07-29 NOTE — H&P (Signed)
Pre-Procedure H&P   Patient ID: Darrell Maunu. is a 62 y.o. male.  Gastroenterology Provider: Jaynie Collins, DO  Referring Provider: Jacob Moores, PA PCP: Alfredia Ferguson, Cordelia Poche  Date: 07/29/2023  HPI Mr. Darrell Nappo. is a 62 y.o. male who presents today for Esophagogastroduodenoscopy for Dysphagia, GERD, esophageal stricture .  Patient with a history of Schatzki's ring requiring dilation.  His last dilation was in November 2020 with a 40 [place].  Normal stomach and duodenum noted at this time.  He underwent dilation in 2014 and 2005 as well.  He has noted dysphagia to solids specifically meats and breads.  No issues with liquids or pills.  No issues with odynophagia.  Reportedly his insurance company would no longer pay for his Protonix and he ran out and was unable to fill the medication.  He is now taking it once a day out-of-pocket.  He feels localized sticking on the xiphoid when he consumes these foods. Now that he is back on his medicine he denies any reflux symptoms.  No other acute GI complaints    Past Medical History:  Diagnosis Date   Avascular necrosis of femur head, left (HCC)    Cellulitis of great toe of left foot    GERD (gastroesophageal reflux disease)    Hx of blood clots    Hypertension    Sleep apnea     Past Surgical History:  Procedure Laterality Date   BACK SURGERY     X2 lower disc    COLONOSCOPY     COLONOSCOPY WITH PROPOFOL N/A 01/13/2023   Procedure: COLONOSCOPY WITH PROPOFOL;  Surgeon: Jaynie Collins, DO;  Location: [facility] ENDOSCOPY;  Service: Gastroenterology;  Laterality: N/A;   KNEE SURGERY Left    X2 scope   TONSILLECTOMY     UPPER GI ENDOSCOPY  07/11/2013   normal duodenum, benign appearing esophageal stricture x2     Family History No h/o GI disease or malignancy  Review of Systems  Constitutional:  Negative for activity change, appetite change, chills, diaphoresis, fatigue, fever and unexpected weight  change.  HENT:  Positive for trouble swallowing. Negative for voice change.   Respiratory:  Negative for shortness of breath and wheezing.   Cardiovascular:  Negative for chest pain, palpitations and leg swelling.  Gastrointestinal:  Negative for abdominal distention, abdominal pain, anal bleeding, blood in stool, constipation, diarrhea, nausea and vomiting.  Musculoskeletal:  Negative for arthralgias and myalgias.  Skin:  Negative for color change and pallor.  Neurological:  Negative for dizziness, syncope and weakness.  Psychiatric/Behavioral:  Negative for confusion. The patient is not nervous/anxious.   All other systems reviewed and are negative.    Medications No current facility-administered medications on file prior to encounter.   Current Outpatient Medications on File Prior to Encounter  Medication Sig Dispense Refill   amLODipine (NORVASC) 10 MG tablet Take 1 tablet (10 mg total) by mouth daily. 90 tablet 3   atorvastatin (LIPITOR) 10 MG tablet TAKE 1 TABLET BY MOUTH EVERY EVENING 90 tablet 0   etodolac (LODINE) 500 MG tablet Take 500 mg by mouth 2 (two) times daily.     losartan (COZAAR) 100 MG tablet Take 1 tablet (100 mg total) by mouth daily. 90 tablet 1   montelukast (SINGULAIR) 10 MG tablet TAKE 1 TABLET BY MOUTH EVERYDAY AT BEDTIME 90 tablet 2   Multiple Vitamin tablet Take by mouth.     pantoprazole (PROTONIX) 40 MG tablet Take 1 tablet (  40 mg total) by mouth daily. 90 tablet 3   traZODone (DESYREL) 100 MG tablet Take 100 mg by mouth at bedtime.     trospium (SANCTURA) 20 MG tablet TAKE 1 TABLET BY MOUTH AT BEDTIME 30 tablet 4   fluticasone (FLONASE) 50 MCG/ACT nasal spray Initial, 2 sprays/nostril daily or 1 spray/nostril twice daily, MAX 2 sprays/nostril/day (200 mcg/day 11.1 mL 2   gabapentin (NEURONTIN) 300 MG capsule Take by mouth.     loratadine (CLARITIN) 10 MG tablet Take 1 tablet (10 mg total) by mouth daily. (Patient taking differently: Take 10 mg by mouth  daily. Takes as needed) 90 tablet 3   psyllium (METAMUCIL) 58.6 % powder Take 1 packet by mouth 3 (three) times daily.     tamsulosin (FLOMAX) 0.4 MG CAPS capsule Take 1 capsule (0.4 mg total) by mouth daily. 90 capsule 1   triamcinolone ointment (KENALOG) 0.5 % Apply 1 Application topically 2 (two) times daily. 15 g 3   Vitamin D, Ergocalciferol, (DRISDOL) 1.25 MG (50000 UNIT) CAPS capsule Take 50,000 Units by mouth every 7 (seven) days.      Pertinent medications related to GI and procedure were reviewed by me with the patient prior to the procedure   Current Facility-Administered Medications:    0.9 %  sodium chloride infusion, , Intravenous, Continuous, Jaynie Collins, DO, Last Rate: 20 mL/hr at 07/29/23 0959, New Bag at 07/29/23 0959  sodium chloride 20 mL/hr at 07/29/23 0981       Allergies  Allergen Reactions   Ace Inhibitors Cough   Allergies were reviewed by me prior to the procedure  Objective   Body mass index is 34.15 kg/m. Vitals:   07/29/23 0950  BP: (!) 154/92  Pulse: 63  Resp: 18  Temp: 97.6 F (36.4 C)  TempSrc: Temporal  SpO2: 96%  Weight: 120.7 kg     Physical Exam Vitals and nursing note reviewed.  Constitutional:      General: He is not in acute distress.    Appearance: Normal appearance. He is not ill-appearing, toxic-appearing or diaphoretic.  HENT:     Head: Normocephalic and atraumatic.     Nose: Nose normal.     Mouth/Throat:     Mouth: Mucous membranes are moist.     Pharynx: Oropharynx is clear.  Eyes:     General: No scleral icterus.    Extraocular Movements: Extraocular movements intact.  Cardiovascular:     Rate and Rhythm: Normal rate and regular rhythm.     Heart sounds: Normal heart sounds. No murmur heard.    No friction rub. No gallop.  Pulmonary:     Effort: Pulmonary effort is normal. No respiratory distress.     Breath sounds: Normal breath sounds. No wheezing, rhonchi or rales.  Abdominal:     General: Abdomen  is flat. Bowel sounds are normal. There is no distension.     Palpations: Abdomen is soft.     Tenderness: There is no abdominal tenderness. There is no guarding or rebound.  Musculoskeletal:     Cervical back: Neck supple.     Right lower leg: No edema.     Left lower leg: No edema.  Skin:    General: Skin is warm and dry.     Coloration: Skin is not jaundiced or pale.  Neurological:     General: No focal deficit present.     Mental Status: He is alert and oriented to person, place, and time. Mental status is at  baseline.  Psychiatric:        Mood and Affect: Mood normal.        Behavior: Behavior normal.        Thought Content: Thought content normal.        Judgment: Judgment normal.      Assessment:  Mr. Darrell Brazzel. is a 62 y.o. male  who presents today for Esophagogastroduodenoscopy for Dysphagia, GERD, esophageal stricture .  Plan:  Esophagogastroduodenoscopy with possible intervention today  Esophagogastroduodenoscopy with possible biopsy, control of bleeding, polypectomy, and interventions as necessary has been discussed with the patient/patient representative. Informed consent was obtained from the patient/patient representative after explaining the indication, nature, and risks of the procedure including but not limited to death, bleeding, perforation, missed neoplasm/lesions, cardiorespiratory compromise, and reaction to medications. Opportunity for questions was given and appropriate answers were provided. Patient/patient representative has verbalized understanding is amenable to undergoing the procedure.   Jaynie Collins, DO  [facility]  Portions of the record may have been created with voice recognition software. Occasional wrong-word or 'sound-a-like' substitutions may have occurred due to the inherent limitations of voice recognition software.  Read the chart carefully and recognize, using context, where substitutions may have  occurred.

## 2023-07-29 NOTE — Transfer of Care (Signed)
Immediate Anesthesia Transfer of Care Note  Patient: Jabriel Higinbotham.  Procedure(s) Performed: ESOPHAGOGASTRODUODENOSCOPY (EGD) WITH PROPOFOL BIOPSY Balloon dilation wire-guided  Patient Location: PACU and Endoscopy Unit  Anesthesia Type:General  Level of Consciousness: drowsy and patient cooperative  Airway & Oxygen Therapy: Patient Spontanous Breathing  Post-op Assessment: Report given to RN and Post -op Vital signs reviewed and stable  Post vital signs: Reviewed and stable  Last Vitals:  Vitals Value Taken Time  BP 112/63 07/29/23 1124  Temp 36.3 C 07/29/23 1122  Pulse 56 07/29/23 1125  Resp 18 07/29/23 1122  SpO2 95 % 07/29/23 1125  Vitals shown include unfiled device data.  Last Pain:  Vitals:   07/29/23 1122  TempSrc: Temporal  PainSc: 0-No pain         Complications: No notable events documented.

## 2023-07-29 NOTE — Op Note (Signed)
[facility] Gastroenterology Patient Name: Darrell Montes Procedure Date: 07/29/2023 10:42 AM MRN: [id number] Account #: [id number] Date of Birth: 11-11-1961 Admit Type: Outpatient Age: 62 Room: [facility] ENDO ROOM 1 Gender: Male Note Status: Finalized Instrument Name: Upper Endoscope [id number] Procedure:             Upper GI endoscopy Indications:           Dysphagia, For therapy of esophageal stricture Providers:             Trenda Moots, DO Referring MD:          Alfredia Ferguson (Referring MD) Medicines:             Monitored Anesthesia Care Complications:         No immediate complications. Estimated blood loss:                         Minimal. Procedure:             Pre-Anesthesia Assessment:                        - Prior to the procedure, a History and Physical was                         performed, and patient medications and allergies were                         reviewed. The patient is competent. The risks and                         benefits of the procedure and the sedation options and                         risks were discussed with the patient. All questions                         were answered and informed consent was obtained.                         Patient identification and proposed procedure were                         verified by the physician, the nurse, the anesthetist                         and the technician in the endoscopy suite. Mental                         Status Examination: alert and oriented. Airway                         Examination: normal oropharyngeal airway and neck                         mobility. Respiratory Examination: clear to                         auscultation. CV Examination: RRR, no murmurs, no S3  or S4. Prophylactic Antibiotics: The patient does not                         require prophylactic antibiotics. Prior                         Anticoagulants: The patient has taken no  anticoagulant                         or antiplatelet agents. ASA Grade Assessment: II - A                         patient with mild systemic disease. After reviewing                         the risks and benefits, the patient was deemed in                         satisfactory condition to undergo the procedure. The                         anesthesia plan was to use monitored anesthesia care                         (MAC). Immediately prior to administration of                         medications, the patient was re-assessed for adequacy                         to receive sedatives. The heart rate, respiratory                         rate, oxygen saturations, blood pressure, adequacy of                         pulmonary ventilation, and response to care were                         monitored throughout the procedure. The physical                         status of the patient was re-assessed after the                         procedure.                        After obtaining informed consent, the endoscope was                         passed under direct vision. Throughout the procedure,                         the patient's blood pressure, pulse, and oxygen                         saturations were monitored continuously. The Endoscope  was introduced through the mouth, and advanced to the                         second part of duodenum. The upper GI endoscopy was                         accomplished without difficulty. The patient tolerated                         the procedure well. Findings:      The duodenal bulb, first portion of the duodenum and second portion of       the duodenum were normal. Estimated blood loss: none.      Four 8 to 20 mm sessile polyps with no bleeding and no stigmata of       recent bleeding were found in the gastric antrum. Biopsies were taken       with a cold forceps for histology. Estimated blood loss was minimal.      A 3 cm hiatal  hernia was present. Estimated blood loss: none.      The exam of the stomach was otherwise normal.      The Z-line was regular. Estimated blood loss: none.      Esophagogastric landmarks were identified: the gastroesophageal junction       was found at 37 cm from the incisors.      Normal mucosa was found in the entire esophagus. Biopsies were obtained       from the proximal and distal esophagus with cold forceps for histology       of suspected eosinophilic esophagitis. Estimated blood loss was minimal.      A non-obstructing Schatzki ring was found at the gastroesophageal       junction. A TTS dilator was passed through the scope. Dilation with a       12-13.5-15 mm balloon and a 15-16.5-18 mm balloon dilator was performed       to 13.5 mm, 15 mm and 16.5 mm. The dilation site was examined following       endoscope reinsertion and showed mild mucosal disruption and moderate       improvement in luminal narrowing. Disruption only occurred after TTS       balloon inflated to 16.80mm; No further dilation performed. Estimated       blood loss was minimal.      The exam was otherwise without abnormality. Impression:            - Normal duodenal bulb, first portion of the duodenum                         and second portion of the duodenum.                        - Four gastric polyps. Biopsied.                        - 3 cm hiatal hernia.                        - Z-line regular.                        - Esophagogastric landmarks identified.                        -  Normal mucosa was found in the entire esophagus.                        - Non-obstructing Schatzki ring. Dilated.                        - The examination was otherwise normal.                        - Biopsies were taken with a cold forceps for                         evaluation of eosinophilic esophagitis. Recommendation:        - Patient has a contact number available for                         emergencies. The signs and  symptoms of potential                         delayed complications were discussed with the patient.                         Return to normal activities tomorrow. Written                         discharge instructions were provided to the patient.                        - Discharge patient to home.                        - Resume previous diet.                        - Continue present medications.                        - PPI should be at 40mg  per day.                        - Await pathology results.                        - Repeat upper endoscopy PRN for retreatment.                        - Return to GI office as previously scheduled.                        - No ibuprofen, naproxen, or other non-steroidal                         anti-inflammatory drugs for 5 days.                        - The findings and recommendations were discussed with                         the patient. Procedure Code(s):     --- Professional ---  [phone], Esophagogastroduodenoscopy, flexible,                         transoral; with transendoscopic balloon dilation of                         esophagus (less than 30 mm diameter)                        43239, 59, Esophagogastroduodenoscopy, flexible,                         transoral; with biopsy, single or multiple Diagnosis Code(s):     --- Professional ---                        K31.7, Polyp of stomach and duodenum                        K44.9, Diaphragmatic hernia without obstruction or                         gangrene                        K22.2, Esophageal obstruction                        R13.10, Dysphagia, unspecified CPT copyright 2022 American Medical Association. All rights reserved. The codes documented in this report are preliminary and upon coder review may  be revised to meet current compliance requirements. Attending Participation:      I personally performed the entire procedure. Elfredia Nevins, DO Jaynie Collins  DO, DO 07/29/2023 11:25:58 AM This report has been signed electronically. Number of Addenda: 0 Note Initiated On: 07/29/2023 10:42 AM Estimated Blood Loss:  Estimated blood loss was minimal.      [facility]

## 2023-07-29 NOTE — Interval H&P Note (Signed)
History and Physical Interval Note: Preprocedure H&P from 07/29/23  was reviewed and there was no interval change after seeing and examining the patient.  Written consent was obtained from the patient after discussion of risks, benefits, and alternatives. Patient has consented to proceed with Esophagogastroduodenoscopy with possible intervention   07/29/2023 10:53 AM  Darrell Montes Sage.  has presented today for surgery, with the diagnosis of R13.19 (ICD-10-CM) - Esophageal dysphagia K21.9, K22.2 (ICD-10-CM) - GERD with stricture Z87.19 (ICD-10-CM) - History of esophageal stricture.  The various methods of treatment have been discussed with the patient and family. After consideration of risks, benefits and other options for treatment, the patient has consented to  Procedure(s): ESOPHAGOGASTRODUODENOSCOPY (EGD) WITH PROPOFOL (N/A) as a surgical intervention.  The patient's history has been reviewed, patient examined, no change in status, stable for surgery.  I have reviewed the patient's chart and labs.  Questions were answered to the patient's satisfaction.     Jaynie Collins

## 2023-07-29 NOTE — Anesthesia Preprocedure Evaluation (Signed)
Anesthesia Evaluation  Patient identified by MRN, date of birth, ID band Patient awake    Reviewed: Allergy & Precautions, H&P , NPO status , Patient's Chart, lab work & pertinent test results, reviewed documented beta blocker date and time   History of Anesthesia Complications Negative for: history of anesthetic complications  Airway Mallampati: III  TM Distance: >3 FB Neck ROM: full    Dental  (+) Dental Advidsory Given, Missing, Teeth Intact   Pulmonary neg shortness of breath, sleep apnea , neg COPD, neg recent URI   Pulmonary exam normal breath sounds clear to auscultation       Cardiovascular Exercise Tolerance: Good hypertension, (-) angina (-) Past MI and (-) Cardiac Stents Normal cardiovascular exam(-) dysrhythmias (-) Valvular Problems/Murmurs Rhythm:regular Rate:Normal     Neuro/Psych negative neurological ROS  negative psych ROS   GI/Hepatic Neg liver ROS,GERD  ,,  Endo/Other  negative endocrine ROS    Renal/GU negative Renal ROS  negative genitourinary   Musculoskeletal   Abdominal   Peds  Hematology negative hematology ROS (+)   Anesthesia Other Findings Past Medical History: No date: Avascular necrosis of femur head, left (HCC) No date: Cellulitis of great toe of left foot No date: GERD (gastroesophageal reflux disease) No date: Hx of blood clots No date: Hypertension No date: Sleep apnea   Reproductive/Obstetrics negative OB ROS                             Anesthesia Physical Anesthesia Plan  ASA: 2  Anesthesia Plan: General   Post-op Pain Management:    Induction: Intravenous  PONV Risk Score and Plan: 2 and Propofol infusion and TIVA  Airway Management Planned: Natural Airway and Nasal Cannula  Additional Equipment:   Intra-op Plan:   Post-operative Plan:   Informed Consent: I have reviewed the patients History and Physical, chart, labs and discussed  the procedure including the risks, benefits and alternatives for the proposed anesthesia with the patient or authorized representative who has indicated his/her understanding and acceptance.     Dental Advisory Given  Plan Discussed with: Anesthesiologist, CRNA and Surgeon  Anesthesia Plan Comments:        Anesthesia Quick Evaluation

## 2023-08-02 ENCOUNTER — Encounter: Payer: Self-pay | Admitting: Gastroenterology

## 2023-08-04 NOTE — Anesthesia Postprocedure Evaluation (Signed)
Anesthesia Post Note  Patient: Darrell Montes.  Procedure(s) Performed: ESOPHAGOGASTRODUODENOSCOPY (EGD) WITH PROPOFOL BIOPSY Balloon dilation wire-guided  Patient location during evaluation: Endoscopy Anesthesia Type: General Level of consciousness: awake and alert Pain management: pain level controlled Vital Signs Assessment: post-procedure vital signs reviewed and stable Respiratory status: spontaneous breathing, nonlabored ventilation, respiratory function stable and patient connected to nasal cannula oxygen Cardiovascular status: blood pressure returned to baseline and stable Postop Assessment: no apparent nausea or vomiting Anesthetic complications: no   No notable events documented.   Last Vitals:  Vitals:   07/29/23 1122 07/29/23 1132  BP: 112/63 115/72  Pulse: (!) 58   Resp: 18   Temp: (!) 36.3 C   SpO2: 96%     Last Pain:  Vitals:   07/30/23 1018  TempSrc:   PainSc: 0-No pain                 Lenard Simmer

## 2023-08-05 ENCOUNTER — Other Ambulatory Visit: Payer: Self-pay | Admitting: Physician Assistant

## 2023-08-05 DIAGNOSIS — J0101 Acute recurrent maxillary sinusitis: Secondary | ICD-10-CM

## 2023-08-17 ENCOUNTER — Encounter: Payer: Self-pay | Admitting: Urology

## 2023-08-17 ENCOUNTER — Ambulatory Visit: Payer: 59 | Admitting: Urology

## 2023-08-17 VITALS — BP 141/82 | HR 80 | Ht 74.0 in | Wt 270.0 lb

## 2023-08-17 DIAGNOSIS — R351 Nocturia: Secondary | ICD-10-CM | POA: Diagnosis not present

## 2023-08-17 LAB — MICROSCOPIC EXAMINATION

## 2023-08-17 LAB — URINALYSIS, COMPLETE
Bilirubin, UA: NEGATIVE
Glucose, UA: NEGATIVE
Ketones, UA: NEGATIVE
Leukocytes,UA: NEGATIVE
Nitrite, UA: NEGATIVE
Protein,UA: NEGATIVE
RBC, UA: NEGATIVE
Specific Gravity, UA: 1.02 (ref 1.005–1.030)
Urobilinogen, Ur: 0.2 mg/dL (ref 0.2–1.0)
pH, UA: 5.5 (ref 5.0–7.5)

## 2023-08-17 MED ORDER — TAMSULOSIN HCL 0.4 MG PO CAPS: 0.8000 mg | ORAL_CAPSULE | Freq: Every day | ORAL | 0 refills | Status: DC

## 2023-08-17 NOTE — Progress Notes (Signed)
Albin Fischer Abdulla,acting as a scribe for Riki Altes, MD., have documented all relevant documentation on the behalf of Riki Altes, MD, as directed by  Riki Altes, MD while in the presence of Riki Altes, MD.   08/17/2023 10:13 AM   Darrell Montes. Mar 08, 1961 [id number]  Referring provider: Alfredia Ferguson, PA-C [address] [place],  [place] [address]  Chief Complaint  Patient presents with   Other    HPI: 62 y.o. male presents for follow up.  First seen 10/2022 for nocturia. History of sleep apnea, diagnosed approximately 8 years ago, but cannot wear due to discomfort. He was given a trial of a nighttime dose of trospium 20 mg, which has not improved his symptoms. He is taking Tamsulosin 0.4 mg daily.   PMH: Past Medical History:  Diagnosis Date   Avascular necrosis of femur head, left (HCC)    Cellulitis of great toe of left foot    GERD (gastroesophageal reflux disease)    Hx of blood clots    Hypertension    Sleep apnea     Surgical History: Past Surgical History:  Procedure Laterality Date   BACK SURGERY     X2 lower disc    BIOPSY  07/29/2023   Procedure: BIOPSY;  Surgeon: Jaynie Collins, DO;  Location: [facility] ENDOSCOPY;  Service: Gastroenterology;;   COLONOSCOPY     COLONOSCOPY WITH PROPOFOL N/A 01/13/2023   Procedure: COLONOSCOPY WITH PROPOFOL;  Surgeon: Jaynie Collins, DO;  Location: [facility] ENDOSCOPY;  Service: Gastroenterology;  Laterality: N/A;   ESOPHAGOGASTRODUODENOSCOPY (EGD) WITH PROPOFOL N/A 07/29/2023   Procedure: ESOPHAGOGASTRODUODENOSCOPY (EGD) WITH PROPOFOL;  Surgeon: Jaynie Collins, DO;  Location: [facility] ENDOSCOPY;  Service: Gastroenterology;  Laterality: N/A;   KNEE SURGERY Left    X2 scope   TONSILLECTOMY     UPPER GI ENDOSCOPY  07/11/2013   normal duodenum, benign appearing esophageal stricture x2     Home Medications:  Allergies as of 08/17/2023       Reactions   Ace Inhibitors Cough         Medication List        Accurate as of August 17, 2023 10:13 AM. If you have any questions, ask your nurse or doctor.          amLODipine 10 MG tablet Commonly known as: NORVASC Take 1 tablet (10 mg total) by mouth daily.   atorvastatin 10 MG tablet Commonly known as: LIPITOR TAKE 1 TABLET BY MOUTH EVERY EVENING   etodolac 500 MG tablet Commonly known as: LODINE Take 500 mg by mouth 2 (two) times daily.   fluticasone 50 MCG/ACT nasal spray Commonly known as: FLONASE INITIAL, 2 SPRAYS/NOSTRIL DAILY OR 1 SPRAY/NOSTRIL TWICE DAILY, MAX 2 SPRAYS/NOSTRIL/DAY (200 MCG/DAY   gabapentin 300 MG capsule Commonly known as: NEURONTIN Take by mouth.   loratadine 10 MG tablet Commonly known as: CLARITIN Take 1 tablet (10 mg total) by mouth daily. What changed: additional instructions   losartan 100 MG tablet Commonly known as: COZAAR Take 1 tablet (100 mg total) by mouth daily.   montelukast 10 MG tablet Commonly known as: SINGULAIR TAKE 1 TABLET BY MOUTH EVERYDAY AT BEDTIME   Multiple Vitamin tablet Take by mouth.   pantoprazole 40 MG tablet Commonly known as: PROTONIX Take 1 tablet (40 mg total) by mouth daily.   psyllium 58.6 % powder Commonly known as: METAMUCIL Take 1 packet by mouth 3 (three) times daily.  tamsulosin 0.4 MG Caps capsule Commonly known as: FLOMAX Take 1 capsule (0.4 mg total) by mouth daily. What changed: Another medication with the same name was added. Make sure you understand how and when to take each. Changed by: Riki Altes   tamsulosin 0.4 MG Caps capsule Commonly known as: FLOMAX Take 2 capsules (0.8 mg total) by mouth daily. What changed: You were already taking a medication with the same name, and this prescription was added. Make sure you understand how and when to take each. Changed by: Riki Altes   traZODone 100 MG tablet Commonly known as: DESYREL Take 100 mg by mouth at bedtime.   triamcinolone ointment 0.5  % Commonly known as: KENALOG Apply 1 Application topically 2 (two) times daily.   trospium 20 MG tablet Commonly known as: SANCTURA TAKE 1 TABLET BY MOUTH AT BEDTIME   Vitamin D (Ergocalciferol) 1.25 MG (50000 UNIT) Caps capsule Commonly known as: DRISDOL Take 50,000 Units by mouth every 7 (seven) days.        Allergies:  Allergies  Allergen Reactions   Ace Inhibitors Cough    Family History: Family History  Problem Relation Age of Onset   COPD Mother    Bladder Cancer Mother    Hypertension Father    COPD Father    Sleep apnea Father     Social History:  reports that he has never smoked. He has never used smokeless tobacco. He reports current alcohol use of about 8.0 standard drinks of alcohol per week. He reports that he does not use drugs.   Physical Exam: BP (!) 141/82   Pulse 80   Ht 6\' 2"  (1.88 m)   Wt 270 lb (122.5 kg)   BMI 34.67 kg/m   Constitutional:  Alert and oriented, No acute distress. HEENT:  AT, moist mucus membranes.  Trachea midline, no masses. Cardiovascular: No clubbing, cyanosis, or edema. Respiratory: Normal respiratory effort, no increased work of breathing. Psychiatric: Normal mood and affect.   Assessment & Plan:    1. Nocturia Most likely secondary to untreated sleep apnea. Recommend reassessment regarding CPAP fit.  Will have him touch base with his PCP In the meantime, will increase Tamsulosin to 0.4 mg twice daily.    I have reviewed the above documentation for accuracy and completeness, and I agree with the above.   Riki Altes, MD  [facility] [address] [place], [place] [address] [phone]

## 2023-08-23 ENCOUNTER — Ambulatory Visit: Payer: 59 | Admitting: Physician Assistant

## 2023-08-24 ENCOUNTER — Other Ambulatory Visit: Payer: Self-pay | Admitting: Surgery

## 2023-08-24 DIAGNOSIS — M1712 Unilateral primary osteoarthritis, left knee: Secondary | ICD-10-CM | POA: Diagnosis not present

## 2023-08-24 DIAGNOSIS — M7062 Trochanteric bursitis, left hip: Secondary | ICD-10-CM

## 2023-08-24 DIAGNOSIS — M87052 Idiopathic aseptic necrosis of left femur: Secondary | ICD-10-CM | POA: Diagnosis not present

## 2023-08-25 ENCOUNTER — Ambulatory Visit: Payer: 59 | Admitting: Family Medicine

## 2023-08-29 ENCOUNTER — Encounter: Payer: Self-pay | Admitting: Family Medicine

## 2023-08-29 ENCOUNTER — Ambulatory Visit (INDEPENDENT_AMBULATORY_CARE_PROVIDER_SITE_OTHER): Payer: 59 | Admitting: Family Medicine

## 2023-08-29 VITALS — BP 118/69 | HR 78 | Temp 98.1°F | Ht 74.0 in | Wt 279.6 lb

## 2023-08-29 DIAGNOSIS — G4733 Obstructive sleep apnea (adult) (pediatric): Secondary | ICD-10-CM | POA: Diagnosis not present

## 2023-08-29 DIAGNOSIS — E559 Vitamin D deficiency, unspecified: Secondary | ICD-10-CM | POA: Insufficient documentation

## 2023-08-29 DIAGNOSIS — I10 Essential (primary) hypertension: Secondary | ICD-10-CM

## 2023-08-29 DIAGNOSIS — D696 Thrombocytopenia, unspecified: Secondary | ICD-10-CM | POA: Diagnosis not present

## 2023-08-29 DIAGNOSIS — M25552 Pain in left hip: Secondary | ICD-10-CM

## 2023-08-29 DIAGNOSIS — Z23 Encounter for immunization: Secondary | ICD-10-CM | POA: Diagnosis not present

## 2023-08-29 DIAGNOSIS — G8929 Other chronic pain: Secondary | ICD-10-CM | POA: Diagnosis not present

## 2023-08-29 DIAGNOSIS — E785 Hyperlipidemia, unspecified: Secondary | ICD-10-CM

## 2023-08-29 DIAGNOSIS — R7303 Prediabetes: Secondary | ICD-10-CM

## 2023-08-29 NOTE — Assessment & Plan Note (Addendum)
Follows with ortho.   Encouraged patient to do hip stretches/as it is given by Ortho

## 2023-08-29 NOTE — Assessment & Plan Note (Addendum)
Has not used CPAP in many years due to discomfort. Discussed option to repeat sleep study and try out different masks that are available, since it has been 11 years since his previous study. Also discussed at [facility] therapy as an option, as well as alternative dentists within the [place] area. Patient chose to defer at this time and will let us know what he decides and if he would like a sleep study ordered to reevaluate his sleep apnea, as well as to be evaluated for new, alternative supplies.

## 2023-08-29 NOTE — Assessment & Plan Note (Signed)
No acute concerns.  Continue to monitor.

## 2023-08-29 NOTE — Progress Notes (Signed)
Established patient visit   Patient: Darrell Montes.   DOB: [birth year]   62 y.o. Male  MRN: [id number] Visit Date: 08/29/2023  Today's healthcare provider: Sherlyn Hay, DO   Chief Complaint  Patient presents with   Medical Management of Chronic Issues    5 month chronic care follow-up. Symptoms none   Subjective    HPI Here for follow-up.  Left hip bothering him, has MRI scheduled via ortho for November.  Has gained some weight; tries to get out and walk but hip causes him a lot of pain.  Stretches were recommended, as well as self-myofascial release.  CPAP options  - Urology thinks consistent use of the CPAP will help with patient's nocturia, his medication is not effectively addressing this.  - Patient is reluctant to restart using his CPAP due to having to carry it around with him when staying in other places, which she does frequently, as well as his original discomfort and difficulty using his CPAP.  - did go through the process with [facility] dental near [place]; recommended a mouth guard. Stopped process due to lack of insurance coverage. Had sleep study in 10/17/2012, and one before that 07/25/2012. 09/2012 shows predominantly obstructive, with some mixed apnea episodes  - CPAP heated and humidified  - had been tested with various masks and BiPAP  Plan by GI 08/04/2023: "- Increase PPI to BID dosing - Stop all NSAIDs (including etodolac)  - If no improvements in swallowing after 8 weeks BID PPI, will need repeat EGD with bigger stretch"      Medications: Outpatient Medications Prior to Visit  Medication Sig   amLODipine (NORVASC) 10 MG tablet Take 1 tablet (10 mg total) by mouth daily.   atorvastatin (LIPITOR) 10 MG tablet TAKE 1 TABLET BY MOUTH EVERY EVENING   etodolac (LODINE) 500 MG tablet Take 500 mg by mouth 2 (two) times daily.   fluticasone (FLONASE) 50 MCG/ACT nasal spray INITIAL, 2 SPRAYS/NOSTRIL DAILY OR 1 SPRAY/NOSTRIL TWICE DAILY, MAX 2  SPRAYS/NOSTRIL/DAY (200 MCG/DAY   loratadine (CLARITIN) 10 MG tablet Take 1 tablet (10 mg total) by mouth daily. (Patient taking differently: Take 10 mg by mouth daily. Takes as needed)   losartan (COZAAR) 100 MG tablet Take 1 tablet (100 mg total) by mouth daily.   montelukast (SINGULAIR) 10 MG tablet TAKE 1 TABLET BY MOUTH EVERYDAY AT BEDTIME   Multiple Vitamin tablet Take by mouth.   pantoprazole (PROTONIX) 40 MG tablet Take 1 tablet (40 mg total) by mouth daily.   psyllium (METAMUCIL) 58.6 % powder Take 1 packet by mouth 3 (three) times daily.   tamsulosin (FLOMAX) 0.4 MG CAPS capsule Take 2 capsules (0.8 mg total) by mouth daily.   traZODone (DESYREL) 100 MG tablet Take 100 mg by mouth at bedtime.   triamcinolone ointment (KENALOG) 0.5 % Apply 1 Application topically 2 (two) times daily.   VITAMIN D, CHOLECALCIFEROL, PO Take 1 tablet by mouth daily.   [DISCONTINUED] tamsulosin (FLOMAX) 0.4 MG CAPS capsule Take 1 capsule (0.4 mg total) by mouth daily.   [DISCONTINUED] trospium (SANCTURA) 20 MG tablet TAKE 1 TABLET BY MOUTH AT BEDTIME   [DISCONTINUED] Vitamin D, Ergocalciferol, (DRISDOL) 1.25 MG (50000 UNIT) CAPS capsule Take 50,000 Units by mouth every 7 (seven) days.   gabapentin (NEURONTIN) 300 MG capsule Take by mouth.   No facility-administered medications prior to visit.    Review of Systems  Constitutional:  Negative for chills and fever.  Respiratory:  Negative.  Negative for cough, shortness of breath and wheezing.   Cardiovascular:  Negative for chest pain, palpitations and leg swelling.  Gastrointestinal:  Negative for abdominal pain, constipation, diarrhea, nausea and vomiting.  Genitourinary:        +nocturia  Musculoskeletal:  Positive for arthralgias (left hip).  Neurological:  Negative for weakness and headaches.        Objective    BP 118/69 (BP Location: Right Arm, Patient Position: Sitting, Cuff Size: Large)   Pulse 78   Temp 98.1 F (36.7 C) (Oral)   Ht 6'  2" (1.88 m)   Wt 279 lb 9.6 oz (126.8 kg)   BMI 35.90 kg/m     Physical Exam Constitutional:      Appearance: Normal appearance.  HENT:     Head: Normocephalic and atraumatic.  Eyes:     General: No scleral icterus.    Extraocular Movements: Extraocular movements intact.     Conjunctiva/sclera: Conjunctivae normal.  Cardiovascular:     Rate and Rhythm: Normal rate and regular rhythm.     Pulses: Normal pulses.     Heart sounds: Normal heart sounds.  Pulmonary:     Effort: Pulmonary effort is normal. No respiratory distress.     Breath sounds: Normal breath sounds.  Abdominal:     General: Bowel sounds are normal. There is no distension.     Palpations: Abdomen is soft. There is no mass.     Tenderness: There is no abdominal tenderness. There is no guarding.  Musculoskeletal:     Right lower leg: No edema.     Left lower leg: No edema.  Skin:    General: Skin is warm and dry.  Neurological:     Mental Status: He is alert and oriented to person, place, and time. Mental status is at baseline.  Psychiatric:        Mood and Affect: Mood normal.        Behavior: Behavior normal.      No results found for any visits on 08/29/23.  Assessment & Plan    Essential (primary) hypertension Assessment & Plan: Managed on losartan 100 mg daily and amlodipine 10 mg daily.  Well controlled.  Orders: -     Comprehensive metabolic panel  Hyperlipidemia, unspecified hyperlipidemia type -     Comprehensive metabolic panel -     Lipid Panel With LDL/HDL Ratio  Prediabetes Assessment & Plan: No acute concerns.  Continue to monitor.  Orders: -     Comprehensive metabolic panel -     Hemoglobin A1c  Thrombocytopenia (HCC) Assessment & Plan: Chronic, most recently 136 in September 2023 Will recheck today .  No acute concerns  Orders: -     CBC with Differential/Platelet  Obstructive sleep apnea Assessment & Plan: Has not used CPAP in many years due to  discomfort. Discussed option to repeat sleep study and try out different masks that are available, since it has been 11 years since his previous study. Also discussed at [facility] therapy as an option, as well as alternative dentists within the [place] area. Patient chose to defer at this time and will let us know what he decides and if he would like a sleep study ordered to reevaluate his sleep apnea, as well as to be evaluated for new, alternative supplies.   Chronic left hip pain Assessment & Plan: Follows with ortho.   Encouraged patient to do hip stretches/as it is given by Ortho   Vitamin D deficiency  Assessment & Plan: Level was 20.5 on 05/27/2022.  Patient took course of high-dose vitamin D and continues to take an unknown dose of over-the-counter vitamin D daily. Will recheck today.   Orders: -     VITAMIN D 25 Hydroxy (Vit-D Deficiency, Fractures)  Immunization due -     Flu vaccine trivalent PF, 6mos and older(Flulaval,Afluria,Fluarix,Fluzone)  Influenza vaccine needed -     Flu vaccine trivalent PF, 6mos and older(Flulaval,Afluria,Fluarix,Fluzone)   Return in about 6 months (around 02/26/2024) for CPE.      I discussed the assessment and treatment plan with the patient  The patient was provided an opportunity to ask questions and all were answered. The patient agreed with the plan and demonstrated an understanding of the instructions.   The patient was advised to call back or seek an in-person evaluation if the symptoms worsen or if the condition fails to improve as anticipated.    Sherlyn Hay, DO  [facility] [phone] (phone) [phone] (fax)  [facility]

## 2023-08-29 NOTE — Assessment & Plan Note (Addendum)
Level was 20.5 on 05/27/2022.  Patient took course of high-dose vitamin D and continues to take an unknown dose of over-the-counter vitamin D daily. Will recheck today.

## 2023-08-29 NOTE — Assessment & Plan Note (Addendum)
Chronic, most recently 136 in September 2023 Will recheck today .  No acute concerns

## 2023-08-29 NOTE — Assessment & Plan Note (Signed)
Managed on losartan 100 mg daily and amlodipine 10 mg daily.  Well controlled.

## 2023-08-30 DIAGNOSIS — E785 Hyperlipidemia, unspecified: Secondary | ICD-10-CM | POA: Diagnosis not present

## 2023-08-30 DIAGNOSIS — D696 Thrombocytopenia, unspecified: Secondary | ICD-10-CM | POA: Diagnosis not present

## 2023-08-30 DIAGNOSIS — R7303 Prediabetes: Secondary | ICD-10-CM | POA: Diagnosis not present

## 2023-08-30 DIAGNOSIS — I10 Essential (primary) hypertension: Secondary | ICD-10-CM | POA: Diagnosis not present

## 2023-08-30 DIAGNOSIS — E559 Vitamin D deficiency, unspecified: Secondary | ICD-10-CM | POA: Diagnosis not present

## 2023-08-31 LAB — COMPREHENSIVE METABOLIC PANEL
ALT: 22 [IU]/L (ref 0–44)
AST: 20 [IU]/L (ref 0–40)
Albumin: 4.2 g/dL (ref 3.9–4.9)
Alkaline Phosphatase: 73 [IU]/L (ref 44–121)
BUN/Creatinine Ratio: 18 (ref 10–24)
BUN: 14 mg/dL (ref 8–27)
Bilirubin Total: 0.4 mg/dL (ref 0.0–1.2)
CO2: 27 mmol/L (ref 20–29)
Calcium: 9.2 mg/dL (ref 8.6–10.2)
Chloride: 105 mmol/L (ref 96–106)
Creatinine, Ser: 0.76 mg/dL (ref 0.76–1.27)
Globulin, Total: 2 g/dL (ref 1.5–4.5)
Glucose: 104 mg/dL — ABNORMAL HIGH (ref 70–99)
Potassium: 4.6 mmol/L (ref 3.5–5.2)
Sodium: 145 mmol/L — ABNORMAL HIGH (ref 134–144)
Total Protein: 6.2 g/dL (ref 6.0–8.5)
eGFR: 102 mL/min/{1.73_m2} (ref >59)

## 2023-08-31 LAB — CBC WITH DIFFERENTIAL/PLATELET
Basophils Absolute: 0 10*3/uL (ref 0.0–0.2)
Basos: 1 %
EOS (ABSOLUTE): 0.2 10*3/uL (ref 0.0–0.4)
Eos: 3 %
Hematocrit: 41.3 % (ref 37.5–51.0)
Hemoglobin: 13.4 g/dL (ref 13.0–17.7)
Immature Grans (Abs): 0.1 10*3/uL (ref 0.0–0.1)
Immature Granulocytes: 1 %
Lymphocytes Absolute: 0.9 10*3/uL (ref 0.7–3.1)
Lymphs: 15 %
MCH: 31.7 pg (ref 26.6–33.0)
MCHC: 32.4 g/dL (ref 31.5–35.7)
MCV: 98 fL — ABNORMAL HIGH (ref 79–97)
Monocytes Absolute: 0.4 10*3/uL (ref 0.1–0.9)
Monocytes: 7 %
Neutrophils Absolute: 4.3 10*3/uL (ref 1.4–7.0)
Neutrophils: 73 %
Platelets: 132 10*3/uL — ABNORMAL LOW (ref 150–450)
RBC: 4.23 x10E6/uL (ref 4.14–5.80)
RDW: 13.1 % (ref 11.6–15.4)
WBC: 6 10*3/uL (ref 3.4–10.8)

## 2023-08-31 LAB — LIPID PANEL WITH LDL/HDL RATIO
Cholesterol, Total: 156 mg/dL (ref 100–199)
HDL: 40 mg/dL (ref >39)
LDL Chol Calc (NIH): 86 mg/dL (ref 0–99)
LDL/HDL Ratio: 2.2 {ratio} (ref 0.0–3.6)
Triglycerides: 176 mg/dL — ABNORMAL HIGH (ref 0–149)
VLDL Cholesterol Cal: 30 mg/dL (ref 5–40)

## 2023-08-31 LAB — HEMOGLOBIN A1C
Est. average glucose Bld gHb Est-mCnc: 114 mg/dL
Hgb A1c MFr Bld: 5.6 % (ref 4.8–5.6)

## 2023-08-31 LAB — VITAMIN D 25 HYDROXY (VIT D DEFICIENCY, FRACTURES): Vit D, 25-Hydroxy: 40.1 ng/mL (ref 30.0–100.0)

## 2023-09-02 ENCOUNTER — Encounter: Payer: Self-pay | Admitting: Surgery

## 2023-09-05 ENCOUNTER — Encounter: Payer: Self-pay | Admitting: Surgery

## 2023-09-07 ENCOUNTER — Other Ambulatory Visit: Payer: 59

## 2023-09-08 ENCOUNTER — Ambulatory Visit
Admission: RE | Admit: 2023-09-08 | Discharge: 2023-09-08 | Disposition: A | Discharge status: Home / Self Care | Payer: 59 | Source: Ambulatory Visit | Attending: Surgery | Admitting: Surgery

## 2023-09-08 DIAGNOSIS — M7062 Trochanteric bursitis, left hip: Secondary | ICD-10-CM

## 2023-09-08 DIAGNOSIS — M7602 Gluteal tendinitis, left hip: Secondary | ICD-10-CM | POA: Diagnosis not present

## 2023-09-08 DIAGNOSIS — M25552 Pain in left hip: Secondary | ICD-10-CM | POA: Diagnosis not present

## 2023-09-08 DIAGNOSIS — M87052 Idiopathic aseptic necrosis of left femur: Secondary | ICD-10-CM | POA: Diagnosis not present

## 2023-09-09 ENCOUNTER — Other Ambulatory Visit: Payer: Self-pay | Admitting: Urology

## 2023-09-26 DIAGNOSIS — K222 Esophageal obstruction: Secondary | ICD-10-CM | POA: Diagnosis not present

## 2023-09-26 DIAGNOSIS — Z8719 Personal history of other diseases of the digestive system: Secondary | ICD-10-CM | POA: Diagnosis not present

## 2023-09-26 DIAGNOSIS — K219 Gastro-esophageal reflux disease without esophagitis: Secondary | ICD-10-CM | POA: Diagnosis not present

## 2023-09-29 ENCOUNTER — Other Ambulatory Visit: Payer: Self-pay | Admitting: Family Medicine

## 2023-09-29 DIAGNOSIS — E785 Hyperlipidemia, unspecified: Secondary | ICD-10-CM

## 2023-12-06 ENCOUNTER — Other Ambulatory Visit: Payer: Self-pay | Admitting: Family Medicine

## 2023-12-06 DIAGNOSIS — E785 Hyperlipidemia, unspecified: Secondary | ICD-10-CM

## 2024-01-13 ENCOUNTER — Other Ambulatory Visit: Payer: Self-pay | Admitting: Physician Assistant

## 2024-01-24 ENCOUNTER — Other Ambulatory Visit: Payer: Self-pay | Admitting: Physician Assistant

## 2024-01-24 DIAGNOSIS — I10 Essential (primary) hypertension: Secondary | ICD-10-CM

## 2024-02-19 ENCOUNTER — Other Ambulatory Visit: Payer: Self-pay | Admitting: Physician Assistant

## 2024-02-19 DIAGNOSIS — J0101 Acute recurrent maxillary sinusitis: Secondary | ICD-10-CM

## 2024-02-23 ENCOUNTER — Encounter: Payer: 59 | Admitting: Family Medicine

## 2024-02-27 ENCOUNTER — Other Ambulatory Visit: Payer: Self-pay | Admitting: Family Medicine

## 2024-02-27 DIAGNOSIS — E785 Hyperlipidemia, unspecified: Secondary | ICD-10-CM

## 2024-06-08 ENCOUNTER — Other Ambulatory Visit: Payer: Self-pay | Admitting: Family Medicine

## 2024-08-11 ENCOUNTER — Other Ambulatory Visit: Payer: Self-pay | Admitting: Family Medicine

## 2024-08-11 DIAGNOSIS — E785 Hyperlipidemia, unspecified: Secondary | ICD-10-CM

## 2024-10-16 ENCOUNTER — Other Ambulatory Visit: Payer: Self-pay | Admitting: Family Medicine

## 2024-11-10 ENCOUNTER — Other Ambulatory Visit: Payer: Self-pay | Admitting: Family Medicine

## 2024-11-10 DIAGNOSIS — E785 Hyperlipidemia, unspecified: Secondary | ICD-10-CM
